# Patient Record
Sex: Female | Born: 1985 | Hispanic: No | Marital: Married | Smoking: Never smoker
Health system: Southern US, Community
[De-identification: ages and names within clinical notes are randomized; demographics above are authoritative.]

## PROBLEM LIST (undated history)

## (undated) ENCOUNTER — Inpatient Hospital Stay (HOSPITAL_COMMUNITY): Payer: Self-pay

## (undated) DIAGNOSIS — O24419 Gestational diabetes mellitus in pregnancy, unspecified control: Secondary | ICD-10-CM

## (undated) DIAGNOSIS — R000 Tachycardia, unspecified: Secondary | ICD-10-CM

## (undated) DIAGNOSIS — S62109A Fracture of unspecified carpal bone, unspecified wrist, initial encounter for closed fracture: Secondary | ICD-10-CM

## (undated) DIAGNOSIS — S92902A Unspecified fracture of left foot, initial encounter for closed fracture: Secondary | ICD-10-CM

## (undated) DIAGNOSIS — R519 Headache, unspecified: Secondary | ICD-10-CM

## (undated) DIAGNOSIS — E119 Type 2 diabetes mellitus without complications: Secondary | ICD-10-CM

## (undated) DIAGNOSIS — D6851 Activated protein C resistance: Secondary | ICD-10-CM

## (undated) DIAGNOSIS — R51 Headache: Secondary | ICD-10-CM

## (undated) HISTORY — DX: Gestational diabetes mellitus in pregnancy, unspecified control: O24.419

## (undated) HISTORY — PX: APPENDECTOMY: SHX54

## (undated) HISTORY — DX: Activated protein C resistance: D68.51

## (undated) HISTORY — DX: Type 2 diabetes mellitus without complications: E11.9

---

## 2015-05-29 ENCOUNTER — Inpatient Hospital Stay (HOSPITAL_COMMUNITY)
Admission: AD | Admit: 2015-05-29 | Discharge: 2015-05-29 | Disposition: A | Discharge status: Home / Self Care | Payer: Medicaid Other | Source: Ambulatory Visit | Attending: Obstetrics and Gynecology | Admitting: Obstetrics and Gynecology

## 2015-05-29 ENCOUNTER — Encounter (HOSPITAL_COMMUNITY): Payer: Self-pay | Admitting: *Deleted

## 2015-05-29 DIAGNOSIS — Z7901 Long term (current) use of anticoagulants: Secondary | ICD-10-CM | POA: Insufficient documentation

## 2015-05-29 DIAGNOSIS — O99119 Other diseases of the blood and blood-forming organs and certain disorders involving the immune mechanism complicating pregnancy, unspecified trimester: Secondary | ICD-10-CM | POA: Diagnosis present

## 2015-05-29 DIAGNOSIS — Z91120 Patient's intentional underdosing of medication regimen due to financial hardship: Secondary | ICD-10-CM | POA: Diagnosis not present

## 2015-05-29 DIAGNOSIS — Z3A25 25 weeks gestation of pregnancy: Secondary | ICD-10-CM | POA: Insufficient documentation

## 2015-05-29 DIAGNOSIS — D6851 Activated protein C resistance: Secondary | ICD-10-CM | POA: Diagnosis present

## 2015-05-29 DIAGNOSIS — O09899 Supervision of other high risk pregnancies, unspecified trimester: Secondary | ICD-10-CM

## 2015-05-29 DIAGNOSIS — Y92009 Unspecified place in unspecified non-institutional (private) residence as the place of occurrence of the external cause: Secondary | ICD-10-CM | POA: Insufficient documentation

## 2015-05-29 DIAGNOSIS — R5383 Other fatigue: Secondary | ICD-10-CM | POA: Diagnosis not present

## 2015-05-29 DIAGNOSIS — O2622 Pregnancy care for patient with recurrent pregnancy loss, second trimester: Secondary | ICD-10-CM | POA: Diagnosis not present

## 2015-05-29 DIAGNOSIS — T45516A Underdosing of anticoagulants, initial encounter: Secondary | ICD-10-CM | POA: Insufficient documentation

## 2015-05-29 DIAGNOSIS — Z7982 Long term (current) use of aspirin: Secondary | ICD-10-CM | POA: Diagnosis not present

## 2015-05-29 DIAGNOSIS — O0932 Supervision of pregnancy with insufficient antenatal care, second trimester: Secondary | ICD-10-CM

## 2015-05-29 DIAGNOSIS — D689 Coagulation defect, unspecified: Secondary | ICD-10-CM | POA: Diagnosis not present

## 2015-05-29 DIAGNOSIS — O99112 Other diseases of the blood and blood-forming organs and certain disorders involving the immune mechanism complicating pregnancy, second trimester: Secondary | ICD-10-CM | POA: Diagnosis not present

## 2015-05-29 DIAGNOSIS — Z758 Other problems related to medical facilities and other health care: Secondary | ICD-10-CM | POA: Diagnosis present

## 2015-05-29 DIAGNOSIS — Z789 Other specified health status: Secondary | ICD-10-CM | POA: Diagnosis present

## 2015-05-29 LAB — TYPE AND SCREEN
ABO/RH(D): O POS
Antibody Screen: NEGATIVE

## 2015-05-29 LAB — URINALYSIS, ROUTINE W REFLEX MICROSCOPIC
Bilirubin Urine: NEGATIVE
Glucose, UA: NEGATIVE mg/dL
Hgb urine dipstick: NEGATIVE
KETONES UR: NEGATIVE mg/dL
LEUKOCYTES UA: NEGATIVE
NITRITE: NEGATIVE
PH: 6 (ref 5.0–8.0)
Protein, ur: NEGATIVE mg/dL

## 2015-05-29 LAB — ABO/RH: ABO/RH(D): O POS

## 2015-05-29 LAB — ANTITHROMBIN III: AntiThromb III Func: 108 % (ref 75–120)

## 2015-05-29 MED ORDER — ASPIRIN 81 MG PO CHEW: 81.0000 mg | CHEWABLE_TABLET | Freq: Every day | ORAL | Status: DC

## 2015-05-29 MED ORDER — ENOXAPARIN SODIUM 40 MG/0.4ML ~~LOC~~ SOLN: 40.0000 mg | SUBCUTANEOUS | Status: DC

## 2015-05-29 MED ORDER — ENOXAPARIN SODIUM 40 MG/0.4ML ~~LOC~~ SOLN
40.0000 mg | Freq: Once | SUBCUTANEOUS | Status: AC
  Administered 2015-05-29: 40 mg via SUBCUTANEOUS
  Filled 2015-05-29: qty 0.4

## 2015-05-29 MED ORDER — CONCEPT OB 130-92.4-1 MG PO CAPS: 1.0000 | ORAL_CAPSULE | Freq: Every day | ORAL | Status: DC

## 2015-05-29 NOTE — Discharge Instructions (Signed)
Handout given

## 2015-05-29 NOTE — Care Management Note (Signed)
Case Management Note  Patient Details  Name: Sonya Turner MRN: [id number] Date of Birth: 06/30/1985  Subjective/Objective:   Blood coagulation disorder complicating pregnancy, second trimester                    Action/Plan: Lovenox 40mg  q day.  Expected Discharge Date:   05/29/15               Expected Discharge Plan:  Home/Self Care  Discharge planning Services  [facility]  Post Acute Care Choice:  NA Choice offered to:  NA  DME Arranged:  N/A DME Agency:  NA  HH Arranged:  NA HH Agency:  NA  Status of Service:   Complete  Additional Comments: Case Manager called by CNM in MAU to assist pt with obtaining her medication.  Stated that pt just arrived in this country from [place] and only speaks Arabic.  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  She was taking Heparin but has nothing at this time.  Can approve initial 30 day supply of Lovenox and would suggest using one of the [facility] if possible (but can use any pharmacy that participates in [facility]). Case Manager spoke to pt's Nurse and CNM.  Case Manager then spoke to the pt via the interpreter line - discussed the medication and the [facility].  Pt stated that she would be able to pay the $3.00 copay.  Explained about the pharmacy choice and [facility] - [facility] did verify that [facility] did have a 30 day supply of the Lovenox dose.  CM gave the pt the [facility] and the CNM was going to print the Rx and give to pt so that she could take it with her.  Pt has CM's card w/ name and cell # on it if she has any problems obtaining the medication.  Explained to the pt that she needs to get the medication today so that she would have it for tomorrow's dose.  Questions answered.  CM also requested that pt call or tell the Clinic when she has about 5 days left of the  medication so that we will have time to assist w/ refilling the Lovenox - pt voiced understanding. CM available to assist as needed.  Roseanne RenoJohnson, Faithlynn Deeley TyroBaker, [place]   [phone] 05/29/2015, 4:45 PM

## 2015-05-29 NOTE — MAU Provider Note (Signed)
Chief Complaint:  Fatigue   First Provider Initiated Contact with Patient 05/29/15 1547      HPI: Sonya Turner is a 29 y.o. [id number] at 2066w3d who presents to maternity admissions [place] and needing to start prenatal care. 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 States she ran out of her medicine. Last took it for 4 days ago. Cannot afford to purchase the medicine. Does not know the name of her clotting disorder. Denies history of DVT, MI, CVA or PE.  Denies contractions, leakage of fluid or vaginal bleeding. Good fetal movement.   [employer] Arabic interpreter used.  Past Medical History: Past Medical History  Diagnosis Date  . Medical history non-contributory     Past obstetric history: OB History  Gravida Para Term Preterm AB SAB TAB Ectopic Multiple Living  8 3 3  4 4    3     # Outcome Date GA Lbr Len/2nd Weight Sex Delivery Anes PTL Lv  8 Current           7 SAB           6 SAB           5 SAB           4 SAB           3 Term           2 Term           1 Term               Past Surgical History: Past Surgical History  Procedure Laterality Date  . Appendectomy       Family History: No family history on file.  Social History: Social History  Substance Use Topics  . Smoking status: Never Smoker   . Smokeless tobacco: Never Used  . Alcohol Use: No    Allergies: No Known Allergies  Meds:  Prescriptions prior to admission  Medication Sig Dispense Refill Last Dose  . Tinzaparin Sodium (INNOHEP [place]) Inject 5,000 Units into the skin every other day.   05/28/2015 at Unknown time  . [DISCONTINUED] aspirin 81 MG chewable tablet Chew 81 mg by mouth daily.   05/26/2015    I have reviewed patient's Past Medical Hx, Surgical Hx, Family Hx, Social Hx, medications and allergies.   ROS:  Review of Systems   Constitutional: Negative for fever and chills.  Gastrointestinal: Negative for abdominal pain.  Genitourinary: Negative for vaginal bleeding.  Hematological: Does not bruise/bleed easily.    Physical Exam  Patient Vitals for the past 24 hrs:  BP Temp Temp src Pulse Resp  05/29/15 1336 107/59 mmHg 98.1 F (36.7 C) Oral 92 18   Constitutional: Well-developed, well-nourished female in no acute distress.  Cardiovascular: normal rate Respiratory: normal effort GI: Abd soft, non-tender, gravid appropriate for gestational age.  Neurologic: Alert and oriented x 4.  GU: Deferred    FHT:  Baseline 140 , moderate variability, accelerations present, no decelerations Contractions: None   Labs: Results for orders placed or performed during the hospital encounter of 05/29/15 (from the past 24 hour(s))  Urinalysis, Routine w reflex microscopic (not at [facility])     Status: Abnormal   Collection Time: 05/29/15  1:40 PM  Result Value Ref Range   Color, Urine YELLOW YELLOW   APPearance HAZY (A)  CLEAR   Specific Gravity, Urine >1.030 (H) 1.005 - 1.030   pH 6.0 5.0 - 8.0   Glucose, UA NEGATIVE NEGATIVE mg/dL   Hgb urine dipstick NEGATIVE NEGATIVE   Bilirubin Urine NEGATIVE NEGATIVE   Ketones, ur NEGATIVE NEGATIVE mg/dL   Protein, ur NEGATIVE NEGATIVE mg/dL   Nitrite NEGATIVE NEGATIVE   Leukocytes, UA NEGATIVE NEGATIVE    Imaging:  No results found.  MAU Course: NST  Unsure of what clotting disorder diagnosis is--possibly anticardiolipin syndrome? CNM researched Innohep and found that it is low molecular weight heparin. Patient was on prophylactic dose.  Discussed patient's history, exam, medical history and anticoagulant therapy with Dr. Jolayne Panther. Referred to high risk clinic. Case management came to assist patient in obtaining Lovenox. New OB panel, hypercoagulability panel, Lovenox 40 mg subcutaneous given. Patient demonstrated ability to give herself injections. Bleeding precautions  reviewed.  MDM: 29 year old female at 70 weeks 3 days gestation with unknown clotting disorder on anticoagulant therapy needing to obtain high risk OB care in the [place] and resume anticoagulant therapy. No evidence of emergent condition.  Assessment: 1. Blood coagulation disorder complicating pregnancy, second trimester (HCC)   2. Limited prenatal care in second trimester     Plan: Discharge home in stable condition.  Preterm labor precautions, bleeding precautions  and fetal kick counts Anatomy scan ordered.  Be panel and hypercoagulability panel pending.  Follow-up Information    Follow [facility] with [facility].   Specialty:  Obstetrics and Gynecology   Why:  Start prenatal care. Your appointment is at 8:20 am. Go to the clinic on the ground floor of [facility].    Contact information:   [address] [place] [address] [phone]      Follow up with [facility] [place] [facility].   Why:  As needed in emergencies   Contact information:   [address] [id number] mc [place] [address] [phone]        Medication List    STOP taking these medications        INNOHEP [place]      TAKE these medications        aspirin 81 MG chewable tablet  Chew 1 tablet (81 mg total) by mouth daily.     CONCEPT OB 130-92.4-1 MG Caps  Take 1 tablet by mouth daily.     enoxaparin 40 MG/0.4ML injection  Commonly known as:  LOVENOX  Inject 0.4 mLs (40 mg total) into the skin daily.        [place], [place] 05/29/2015 3:39 PM

## 2015-05-29 NOTE — MAU Note (Signed)
Pt reports she is feeling fatigued and wants to make sure the baby is ok. Has had prenatal care in [place]. Has been here in [place] for 3 days. Does not have prenatal records with her. Pt stated she is taking a baby aspirin daily and an injection called Inoheeb (blood thinner) . Because she said that her blood clots  stop blood flow to the placenta.

## 2015-06-12 ENCOUNTER — Encounter: Payer: Self-pay | Admitting: Obstetrics & Gynecology

## 2015-06-17 ENCOUNTER — Encounter: Payer: Self-pay | Admitting: Certified Nurse Midwife

## 2015-06-22 NOTE — L&D Delivery Note (Signed)
Patient is 30 y.o. [id number] 3813w5d admitted in active labor, hx of Heterozygous factor V Leiden affecting pregnancy.  Delivery Note At 4:31 PM a viable female was delivered via Vaginal, Spontaneous Delivery (Presentation: ;  ).  APGAR: 9, 9; weight pending.  Placenta status: Intact, Spontaneous.  Cord: 3 vessels with the following complications: None . Upon arrival patient was complete and pushing. She pushed with good maternal effort to deliver a healthy baby. Baby delivered without difficulty, was noted to have good tone and place on maternal abdomen for drying and stimulation. Delayed cord clamping performed.   Anesthesia: None  Episiotomy: None Lacerations: 1 degree perineal Est. Blood Loss (mL):  181  Mom to postpartum.  Baby to Couplet care / Skin to Skin.   [name], DO 09/06/2015, 4:48 PM PGY-2, [facility]  I was present for the entire delivery of baby and placenta and inspection of perineum and agree with above. 1st degree perineal lac hemostatic. No repair.   [name], [place] 09/06/2015 5:13 PM

## 2015-06-27 ENCOUNTER — Ambulatory Visit (HOSPITAL_COMMUNITY)
Admission: RE | Admit: 2015-06-27 | Discharge: 2015-06-27 | Disposition: A | Discharge status: Home / Self Care | Payer: Medicaid Other | Source: Ambulatory Visit | Attending: Advanced Practice Midwife | Admitting: Advanced Practice Midwife

## 2015-06-27 ENCOUNTER — Encounter (HOSPITAL_COMMUNITY): Payer: Self-pay

## 2015-06-27 DIAGNOSIS — O0932 Supervision of pregnancy with insufficient antenatal care, second trimester: Secondary | ICD-10-CM

## 2015-06-27 DIAGNOSIS — O0933 Supervision of pregnancy with insufficient antenatal care, third trimester: Secondary | ICD-10-CM | POA: Insufficient documentation

## 2015-06-27 DIAGNOSIS — D689 Coagulation defect, unspecified: Secondary | ICD-10-CM

## 2015-06-27 DIAGNOSIS — Z3A29 29 weeks gestation of pregnancy: Secondary | ICD-10-CM | POA: Insufficient documentation

## 2015-06-27 DIAGNOSIS — O99112 Other diseases of the blood and blood-forming organs and certain disorders involving the immune mechanism complicating pregnancy, second trimester: Secondary | ICD-10-CM

## 2015-06-27 DIAGNOSIS — Z36 Encounter for antenatal screening of mother: Secondary | ICD-10-CM | POA: Insufficient documentation

## 2015-06-27 LAB — LUPUS ANTICOAGULANT PANEL
DRVVT: 42.6 s (ref 0.0–44.0)
PTT LA: 49.3 s — AB (ref 0.0–40.6)

## 2015-06-27 LAB — CARDIOLIPIN ANTIBODIES, IGG, IGM, IGA
Anticardiolipin IgA: <9 APL U/mL (ref 0–11)
Anticardiolipin IgG: <9 GPL U/mL (ref 0–14)

## 2015-06-27 LAB — RPR: RPR Ser Ql: NONREACTIVE

## 2015-06-27 LAB — PROTHROMBIN GENE MUTATION

## 2015-06-27 LAB — PROTEIN S ACTIVITY: Protein S Activity: 43 % — ABNORMAL LOW (ref 63–140)

## 2015-06-27 LAB — RUBELLA SCREEN: Rubella: 6.24 index (ref >0.99)

## 2015-06-27 LAB — BETA-2-GLYCOPROTEIN I ABS, IGG/M/A: Beta-2-Glycoprotein I IgM: <9 GPI IgM units (ref 0–32)

## 2015-06-27 LAB — HEPATITIS B SURFACE ANTIGEN: HEP B S AG: NEGATIVE

## 2015-06-27 LAB — FACTOR 5 LEIDEN

## 2015-06-27 LAB — HEXAGONAL PHASE PHOSPHOLIPID: HEXAGONAL PHASE PHOSPHOLIPID: 0 s (ref 0–11)

## 2015-06-27 LAB — PROTEIN S, TOTAL: PROTEIN S AG TOTAL: 90 % (ref 60–150)

## 2015-06-27 LAB — PTT-LA MIX: PTT-LA Mix: 43.2 s — ABNORMAL HIGH (ref 0.0–40.6)

## 2015-06-27 LAB — HOMOCYSTEINE: Homocysteine: 3.1 umol/L (ref 0.0–15.0)

## 2015-06-27 LAB — HIV ANTIBODY (ROUTINE TESTING W REFLEX): HIV SCREEN 4TH GENERATION: NONREACTIVE

## 2015-06-27 LAB — PROTEIN C, TOTAL: PROTEIN C, TOTAL: 117 % (ref 60–150)

## 2015-06-27 LAB — PROTEIN C ACTIVITY: PROTEIN C ACTIVITY: 154 % (ref 73–180)

## 2015-07-03 ENCOUNTER — Ambulatory Visit (INDEPENDENT_AMBULATORY_CARE_PROVIDER_SITE_OTHER): Payer: Medicaid Other | Admitting: Family Medicine

## 2015-07-03 ENCOUNTER — Encounter: Payer: Self-pay | Admitting: Family Medicine

## 2015-07-03 VITALS — BP 116/72 | HR 126 | Wt 136.9 lb

## 2015-07-03 DIAGNOSIS — D6851 Activated protein C resistance: Secondary | ICD-10-CM | POA: Diagnosis not present

## 2015-07-03 DIAGNOSIS — Z789 Other specified health status: Secondary | ICD-10-CM

## 2015-07-03 DIAGNOSIS — Z113 Encounter for screening for infections with a predominantly sexual mode of transmission: Secondary | ICD-10-CM

## 2015-07-03 DIAGNOSIS — Z23 Encounter for immunization: Secondary | ICD-10-CM

## 2015-07-03 DIAGNOSIS — O99119 Other diseases of the blood and blood-forming organs and certain disorders involving the immune mechanism complicating pregnancy, unspecified trimester: Secondary | ICD-10-CM | POA: Diagnosis not present

## 2015-07-03 DIAGNOSIS — O2623 Pregnancy care for patient with recurrent pregnancy loss, third trimester: Secondary | ICD-10-CM | POA: Diagnosis not present

## 2015-07-03 DIAGNOSIS — O09893 Supervision of other high risk pregnancies, third trimester: Secondary | ICD-10-CM

## 2015-07-03 DIAGNOSIS — O2622 Pregnancy care for patient with recurrent pregnancy loss, second trimester: Secondary | ICD-10-CM | POA: Diagnosis not present

## 2015-07-03 DIAGNOSIS — O99113 Other diseases of the blood and blood-forming organs and certain disorders involving the immune mechanism complicating pregnancy, third trimester: Secondary | ICD-10-CM

## 2015-07-03 LAB — CBC
HEMATOCRIT: 33.2 % — AB (ref 36.0–46.0)
HEMOGLOBIN: 11.3 g/dL — AB (ref 12.0–15.0)
MCH: 29 pg (ref 26.0–34.0)
MCHC: 34 g/dL (ref 30.0–36.0)
MCV: 85.1 fL (ref 78.0–100.0)
MPV: 9.1 fL (ref 8.6–12.4)
Platelets: 300 10*3/uL (ref 150–400)
RBC: 3.9 MIL/uL (ref 3.87–5.11)
RDW: 13.4 % (ref 11.5–15.5)
WBC: 7 10*3/uL (ref 4.0–10.5)

## 2015-07-03 LAB — POCT URINALYSIS DIP (DEVICE)
BILIRUBIN URINE: NEGATIVE
Glucose, UA: NEGATIVE mg/dL
Hgb urine dipstick: NEGATIVE
Ketones, ur: NEGATIVE mg/dL
Leukocytes, UA: NEGATIVE
NITRITE: NEGATIVE
PH: 5.5 (ref 5.0–8.0)
PROTEIN: NEGATIVE mg/dL
Specific Gravity, Urine: >=1.03 (ref 1.005–1.030)
Urobilinogen, UA: 0.2 mg/dL (ref 0.0–1.0)

## 2015-07-03 MED ORDER — CONCEPT OB 130-92.4-1 MG PO CAPS: 1.0000 | ORAL_CAPSULE | Freq: Every day | ORAL | Status: DC

## 2015-07-03 MED ORDER — TETANUS-DIPHTH-ACELL PERTUSSIS 5-2.5-18.5 LF-MCG/0.5 IM SUSP
0.5000 mL | Freq: Once | INTRAMUSCULAR | Status: AC
  Administered 2015-07-03: 0.5 mL via INTRAMUSCULAR

## 2015-07-03 MED ORDER — ENOXAPARIN SODIUM 40 MG/0.4ML ~~LOC~~ SOLN: 40.0000 mg | SUBCUTANEOUS | Status: DC

## 2015-07-03 NOTE — Progress Notes (Signed)
Subjective:  Sonya Turner is a 30 y.o. [id number] at 4040w3d being seen today for ongoing prenatal care.  She is currently monitored for the following issues for this high-risk pregnancy and has Supervision of other high risk pregnancy, antepartum; Heterozygous factor V Leiden affecting pregnancy, antepartum (HCC); Language barrier; Limited prenatal care in second trimester; and Four previous miscarriages, affecting care of mother in second trimester, antepartum on her problem list.  Patient reports no complaints.  Contractions: Not present. Vag. Bleeding: None.  Movement: Present. Denies leaking of fluid.   The following portions of the patient's history were reviewed and updated as appropriate: allergies, current medications, past family history, past medical history, past social history, past surgical history and problem list. Problem list updated.  Objective:   Filed Vitals:   07/03/15 1327  BP: 116/72  Pulse: 126  Weight: 136 lb 14.4 oz (62.097 kg)    Fetal Status: Fetal Heart Rate (bpm): 144 Fundal Height: 29 cm Movement: Present     General:  Alert, oriented and cooperative. Patient is in no acute distress.  Skin: Skin is warm and dry. No rash noted.   Cardiovascular: Normal heart rate noted  Respiratory: Normal respiratory effort, no problems with respiration noted  Abdomen: Soft, gravid, appropriate for gestational age. Pain/Pressure: Present     Pelvic: Vag. Bleeding: None     Cervical exam deferred        Extremities: Normal range of motion.  Edema: None  Mental Status: Normal mood and affect. Normal behavior. Normal judgment and thought content.   Urinalysis:      Assessment and Plan:  Pregnancy: [id number] at 6940w3d  1. Heterozygous factor V Leiden affecting pregnancy, antepartum (HCC) -Discussed diagnosis today, patient previously unaware of condition - enoxaparin (LOVENOX) 40 MG/0.4ML injection; Inject 0.4 mLs (40 mg total) into the skin daily.  Dispense: 30 Syringe; Refill:  6  2. Four previous miscarriages, affecting care of mother in second trimester, antepartum Likely 2/2 to above  3. Language barrier Arabic speaking, interpreter used.   4. Supervision of other high risk pregnancy, antepartum, third trimester - Glucose Tolerance, 1 HR (50g) w/o Fasting - CBC - Culture, OB Urine - Prescript Monitor Profile(19) - GC/Chlamydia probe amp ([place])not at [facility] - Prenat w/o A Vit-FeFum-FePo-FA (CONCEPT OB) 130-92.4-1 MG CAPS; Take 1 tablet by mouth daily.  Dispense: 30 capsule; Refill: 12  Preterm labor symptoms and general obstetric precautions including but not limited to vaginal bleeding, contractions, leaking of fluid and fetal movement were reviewed in detail with the patient. Please refer to After Visit Summary for other counseling recommendations.   Return in about 2 weeks (around 07/17/2015) for Routine prenatal care.  Future Appointments Date Time Provider Department Center  07/17/2015 10:15 AM Catalina AntiguaPeggy Constant, MD [facility]   Federico FlakeKimberly Niles Beronica Lansdale, MD

## 2015-07-03 NOTE — Patient Instructions (Addendum)
Safe Medications in Pregnancy   Acne: Benzoyl Peroxide Salicylic Acid  Backache/Headache: Tylenol: 2 regular strength every 4 hours OR              2 Extra strength every 6 hours  Colds/Coughs/Allergies: Benadryl (alcohol free) 25 mg every 6 hours as needed Breath right strips Claritin Cepacol throat lozenges Chloraseptic throat spray Cold-Eeze- up to three times per day Cough drops, alcohol free Flonase (by prescription only) Guaifenesin Mucinex Robitussin DM (plain only, alcohol free) Saline nasal spray/drops Sudafed (pseudoephedrine) & Actifed ** use only after [redacted] weeks gestation and if you do not have high blood pressure Tylenol Vicks Vaporub Zinc lozenges Zyrtec   Constipation: Colace Ducolax suppositories Fleet enema Glycerin suppositories Metamucil Milk of magnesia Miralax Senokot Smooth move tea  Diarrhea: Kaopectate Imodium A-D  *NO pepto Bismol  Hemorrhoids: Anusol Anusol HC Preparation H Tucks  Indigestion: Tums Maalox Mylanta Zantac  Pepcid  Insomnia: Benadryl (alcohol free) 25mg every 6 hours as needed Tylenol PM Unisom, no Gelcaps  Leg Cramps: Tums MagGel  Nausea/Vomiting:  Bonine Dramamine Emetrol Ginger extract Sea bands Meclizine  Nausea medication to take during pregnancy:  Unisom (doxylamine succinate 25 mg tablets) Take one tablet daily at bedtime. If symptoms are not adequately controlled, the dose can be increased to a maximum recommended dose of two tablets daily (1/2 tablet in the morning, 1/2 tablet mid-afternoon and one at bedtime). Vitamin B6 100mg tablets. Take one tablet twice a day (up to 200 mg per day).  Skin Rashes: Aveeno products Benadryl cream or 25mg every 6 hours as needed Calamine Lotion 1% cortisone cream  Yeast infection: Gyne-lotrimin 7 Monistat 7   **If taking multiple medications, please check labels to avoid duplicating the same active ingredients **take medication as directed on  the label ** Do not exceed 4000 mg of tylenol in 24 hours **Do not take medications that contain aspirin or ibuprofen    Third Trimester of Pregnancy The third trimester is from week 29 through week 42, months 7 through 9. The third trimester is a time when the fetus is growing rapidly. At the end of the ninth month, the fetus is about 20 inches in length and weighs 6-10 pounds.  BODY CHANGES Your body goes through many changes during pregnancy. The changes vary from woman to woman.   Your weight will continue to increase. You can expect to gain 25-35 pounds (11-16 kg) by the end of the pregnancy.  You may begin to get stretch marks on your hips, abdomen, and breasts.  You may urinate more often because the fetus is moving lower into your pelvis and pressing on your bladder.  You may develop or continue to have heartburn as a result of your pregnancy.  You may develop constipation because certain hormones are causing the muscles that push waste through your intestines to slow down.  You may develop hemorrhoids or swollen, bulging veins (varicose veins).  You may have pelvic pain because of the weight gain and pregnancy hormones relaxing your joints between the bones in your pelvis. Backaches may result from overexertion of the muscles supporting your posture.  You may have changes in your hair. These can include thickening of your hair, rapid growth, and changes in texture. Some women also have hair loss during or after pregnancy, or hair that feels dry or thin. Your hair will most likely return to normal after your baby is born.  Your breasts will continue to grow and be tender. A yellow discharge   may leak from your breasts called colostrum.  Your belly button may stick out.  You may feel short of breath because of your expanding uterus.  You may notice the fetus "dropping," or moving lower in your abdomen.  You may have a bloody mucus discharge. This usually occurs a few days to  a week before labor begins.  Your cervix becomes thin and soft (effaced) near your due date. WHAT TO EXPECT AT YOUR PRENATAL EXAMS  You will have prenatal exams every 2 weeks until week 36. Then, you will have weekly prenatal exams. During a routine prenatal visit:  You will be weighed to make sure you and the fetus are growing normally.  Your blood pressure is taken.  Your abdomen will be measured to track your baby's growth.  The fetal heartbeat will be listened to.  Any test results from the previous visit will be discussed.  You may have a cervical check near your due date to see if you have effaced. At around 36 weeks, your caregiver will check your cervix. At the same time, your caregiver will also perform a test on the secretions of the vaginal tissue. This test is to determine if a type of bacteria, Group B streptococcus, is present. Your caregiver will explain this further. Your caregiver may ask you:  What your birth plan is.  How you are feeling.  If you are feeling the baby move.  If you have had any abnormal symptoms, such as leaking fluid, bleeding, severe headaches, or abdominal cramping.  If you are using any tobacco products, including cigarettes, chewing tobacco, and electronic cigarettes.  If you have any questions. Other tests or screenings that may be performed during your third trimester include:  Blood tests that check for low iron levels (anemia).  Fetal testing to check the health, activity level, and growth of the fetus. Testing is done if you have certain medical conditions or if there are problems during the pregnancy.  HIV (human immunodeficiency virus) testing. If you are at high risk, you may be screened for HIV during your third trimester of pregnancy. FALSE LABOR You may feel small, irregular contractions that eventually go away. These are called Braxton Hicks contractions, or false labor. Contractions may last for hours, days, or even weeks  before true labor sets in. If contractions come at regular intervals, intensify, or become painful, it is best to be seen by your caregiver.  SIGNS OF LABOR   Menstrual-like cramps.  Contractions that are 5 minutes apart or less.  Contractions that start on the top of the uterus and spread down to the lower abdomen and back.  A sense of increased pelvic pressure or back pain.  A watery or bloody mucus discharge that comes from the vagina. If you have any of these signs before the 37th week of pregnancy, call your caregiver right away. You need to go to the hospital to get checked immediately. HOME CARE INSTRUCTIONS   Avoid all smoking, herbs, alcohol, and unprescribed drugs. These chemicals affect the formation and growth of the baby.  Do not use any tobacco products, including cigarettes, chewing tobacco, and electronic cigarettes. If you need help quitting, ask your health care provider. You may receive counseling support and other resources to help you quit.  Follow your caregiver's instructions regarding medicine use. There are medicines that are either safe or unsafe to take during pregnancy.  Exercise only as directed by your caregiver. Experiencing uterine cramps is a good sign to stop   exercising.  Continue to eat regular, healthy meals.  Wear a good support bra for breast tenderness.  Do not use hot tubs, steam rooms, or saunas.  Wear your seat belt at all times when driving.  Avoid raw meat, uncooked cheese, cat litter boxes, and soil used by cats. These carry germs that can cause birth defects in the baby.  Take your prenatal vitamins.  Take 1500-2000 mg of calcium daily starting at the 20th week of pregnancy until you deliver your baby.  Try taking a stool softener (if your caregiver approves) if you develop constipation. Eat more high-fiber foods, such as fresh vegetables or fruit and whole grains. Drink plenty of fluids to keep your urine clear or pale  yellow.  Take warm sitz baths to soothe any pain or discomfort caused by hemorrhoids. Use hemorrhoid cream if your caregiver approves.  If you develop varicose veins, wear support hose. Elevate your feet for 15 minutes, 3-4 times a day. Limit salt in your diet.  Avoid heavy lifting, wear low heal shoes, and practice good posture.  Rest a lot with your legs elevated if you have leg cramps or low back pain.  Visit your dentist if you have not gone during your pregnancy. Use a soft toothbrush to brush your teeth and be gentle when you floss.  A sexual relationship may be continued unless your caregiver directs you otherwise.  Do not travel far distances unless it is absolutely necessary and only with the approval of your caregiver.  Take prenatal classes to understand, practice, and ask questions about the labor and delivery.  Make a trial run to the hospital.  Pack your hospital bag.  Prepare the baby's nursery.  Continue to go to all your prenatal visits as directed by your caregiver. SEEK MEDICAL CARE IF:  You are unsure if you are in labor or if your water has broken.  You have dizziness.  You have mild pelvic cramps, pelvic pressure, or nagging pain in your abdominal area.  You have persistent nausea, vomiting, or diarrhea.  You have a bad smelling vaginal discharge.  You have pain with urination. SEEK IMMEDIATE MEDICAL CARE IF:   You have a fever.  You are leaking fluid from your vagina.  You have spotting or bleeding from your vagina.  You have severe abdominal cramping or pain.  You have rapid weight loss or gain.  You have shortness of breath with chest pain.  You notice sudden or extreme swelling of your face, hands, ankles, feet, or legs.  You have not felt your baby move in over an hour.  You have severe headaches that do not go away with medicine.  You have vision changes.   This information is not intended to replace advice given to you by your  health care provider. Make sure you discuss any questions you have with your health care provider.   Document Released: 06/01/2001 Document Revised: 06/28/2014 Document Reviewed: 08/08/2012 Elsevier Interactive Patient Education 2016 Elsevier Inc.  

## 2015-07-03 NOTE — Addendum Note (Signed)
Addended by: Geanie BerlinNEWTON, Annisa Mazzarella N on: 07/03/2015 04:23 PM   Modules accepted: Orders, Medications

## 2015-07-04 LAB — PRESCRIPTION MONITORING PROFILE (19 PANEL)
Amphetamine/Meth: NEGATIVE ng/mL
BARBITURATE SCREEN, URINE: NEGATIVE ng/mL
BENZODIAZEPINE SCREEN, URINE: NEGATIVE ng/mL
Buprenorphine, Urine: NEGATIVE ng/mL
Cannabinoid Scrn, Ur: NEGATIVE ng/mL
Carisoprodol, Urine: NEGATIVE ng/mL
Cocaine Metabolites: NEGATIVE ng/mL
Creatinine, Urine: 119.81 mg/dL (ref >20.0)
Fentanyl, Ur: NEGATIVE ng/mL
MDMA URINE: NEGATIVE ng/mL
Meperidine, Ur: NEGATIVE ng/mL
Methadone Screen, Urine: NEGATIVE ng/mL
Methaqualone: NEGATIVE ng/mL
NITRITES URINE, INITIAL: NEGATIVE ug/mL
OPIATE SCREEN, URINE: NEGATIVE ng/mL
Oxycodone Screen, Ur: NEGATIVE ng/mL
PROPOXYPHENE: NEGATIVE ng/mL
Phencyclidine, Ur: NEGATIVE ng/mL
TRAMADOL UR: NEGATIVE ng/mL
Tapentadol, urine: NEGATIVE ng/mL
ZOLPIDEM, URINE: NEGATIVE ng/mL
pH, Initial: 5.6 pH (ref 4.5–8.9)

## 2015-07-04 LAB — GLUCOSE TOLERANCE, 1 HOUR (50G) W/O FASTING: GLUCOSE 1 HOUR GTT: 144 mg/dL — AB (ref 70–140)

## 2015-07-05 LAB — CULTURE, OB URINE: Colony Count: 60000

## 2015-07-11 ENCOUNTER — Telehealth: Payer: Self-pay | Admitting: *Deleted

## 2015-07-11 NOTE — Telephone Encounter (Signed)
Attempted to call patient using Arabic interpreter [phone]. Per Dr Alvester Morin she needs to be contacted to schedule a 3 hr gtt. There was no answer, voice mail was left stating we are calling with test results, please call the clinic.

## 2015-07-14 NOTE — Telephone Encounter (Signed)
Called pt with [facility] Interpreter # [phone] and left message stating that I am calling with test result information. Please call back.  ** Pt has clinic appt on 1/26 - note placed in appt info

## 2015-07-17 ENCOUNTER — Ambulatory Visit (INDEPENDENT_AMBULATORY_CARE_PROVIDER_SITE_OTHER): Payer: Medicaid Other | Admitting: Obstetrics and Gynecology

## 2015-07-17 ENCOUNTER — Encounter: Payer: Self-pay | Admitting: Obstetrics and Gynecology

## 2015-07-17 VITALS — BP 114/63 | HR 108 | Temp 98.2°F | Wt 141.6 lb

## 2015-07-17 DIAGNOSIS — D6851 Activated protein C resistance: Secondary | ICD-10-CM | POA: Diagnosis not present

## 2015-07-17 DIAGNOSIS — O0933 Supervision of pregnancy with insufficient antenatal care, third trimester: Secondary | ICD-10-CM | POA: Diagnosis present

## 2015-07-17 DIAGNOSIS — O2622 Pregnancy care for patient with recurrent pregnancy loss, second trimester: Secondary | ICD-10-CM

## 2015-07-17 DIAGNOSIS — O0932 Supervision of pregnancy with insufficient antenatal care, second trimester: Secondary | ICD-10-CM

## 2015-07-17 DIAGNOSIS — O99119 Other diseases of the blood and blood-forming organs and certain disorders involving the immune mechanism complicating pregnancy, unspecified trimester: Secondary | ICD-10-CM

## 2015-07-17 DIAGNOSIS — Z789 Other specified health status: Secondary | ICD-10-CM | POA: Diagnosis not present

## 2015-07-17 DIAGNOSIS — O09893 Supervision of other high risk pregnancies, third trimester: Secondary | ICD-10-CM

## 2015-07-17 DIAGNOSIS — O2623 Pregnancy care for patient with recurrent pregnancy loss, third trimester: Secondary | ICD-10-CM | POA: Diagnosis not present

## 2015-07-17 LAB — POCT URINALYSIS DIP (DEVICE)
BILIRUBIN URINE: NEGATIVE
GLUCOSE, UA: NEGATIVE mg/dL
Hgb urine dipstick: NEGATIVE
KETONES UR: NEGATIVE mg/dL
Nitrite: NEGATIVE
PH: 5.5 (ref 5.0–8.0)
Protein, ur: NEGATIVE mg/dL
Urobilinogen, UA: 0.2 mg/dL (ref 0.0–1.0)

## 2015-07-17 NOTE — Progress Notes (Signed)
Breastfeeding tip of the week reviewed Patient needs 3 hr gtt, will schedule test before she leaves

## 2015-07-17 NOTE — Progress Notes (Signed)
Subjective:  Sonya Turner is a 30 y.o. [id number] at [redacted]w[redacted]d being seen today for ongoing prenatal care.  She is currently monitored for the following issues for this high-risk pregnancy and has Supervision of other high risk pregnancy, antepartum; Heterozygous factor V Leiden affecting pregnancy, antepartum (HCC); Language barrier; Limited prenatal care in second trimester; and Four previous miscarriages, affecting care of mother in second trimester, antepartum on her problem list.  Patient reports no complaints.  Contractions: Not present. Vag. Bleeding: None.  Movement: Present. Denies leaking of fluid.   The following portions of the patient's history were reviewed and updated as appropriate: allergies, current medications, past family history, past medical history, past social history, past surgical history and problem list. Problem list updated.  Objective:   Filed Vitals:   07/17/15 1053  BP: 114/63  Pulse: 108  Temp: 98.2 F (36.8 C)  Weight: 141 lb 9.6 oz (64.229 kg)    Fetal Status: Fetal Heart Rate (bpm): 140   Movement: Present     General:  Alert, oriented and cooperative. Patient is in no acute distress.  Skin: Skin is warm and dry. No rash noted.   Cardiovascular: Normal heart rate noted  Respiratory: Normal respiratory effort, no problems with respiration noted  Abdomen: Soft, gravid, appropriate for gestational age. Pain/Pressure: Present     Pelvic: Vag. Bleeding: None     Cervical exam deferred        Extremities: Normal range of motion.  Edema: None  Mental Status: Normal mood and affect. Normal behavior. Normal judgment and thought content.   Urinalysis: Urine Protein: Negative Urine Glucose: Negative  Assessment and Plan:  Pregnancy: [name] at [redacted]w[redacted]d  1. Supervision of other high risk pregnancy, antepartum, third trimester Patient is doing well Informed of abnormal 1 hour glucola and need for 3 hour test. Patient will return prior to next appointment for it  2.  Limited prenatal care in second trimester   3. Language barrier Arabic interpreter present   4. Heterozygous factor V Leiden affecting pregnancy, antepartum (HCC) Continue lovenox  5. Four previous miscarriages, affecting care of mother in second trimester, antepartum   Preterm labor symptoms and general obstetric precautions including but not limited to vaginal bleeding, contractions, leaking of fluid and fetal movement were reviewed in detail with the patient. Please refer to After Visit Summary for other counseling recommendations.  Return in about 2 weeks (around 07/31/2015).   Catalina Antigua, MD

## 2015-07-21 ENCOUNTER — Other Ambulatory Visit: Payer: Medicaid Other

## 2015-07-21 DIAGNOSIS — O99810 Abnormal glucose complicating pregnancy: Secondary | ICD-10-CM

## 2015-07-22 ENCOUNTER — Other Ambulatory Visit: Payer: Medicaid Other

## 2015-07-22 LAB — GLUCOSE TOLERANCE, 3 HOURS
GLUCOSE 3 HOUR GTT: 81 mg/dL (ref 70–144)
Glucose Tolerance, 1 hour: 148 mg/dL (ref 70–189)
Glucose Tolerance, 2 hour: 162 mg/dL (ref 70–164)
Glucose Tolerance, Fasting: 79 mg/dL (ref 65–99)

## 2015-07-28 ENCOUNTER — Ambulatory Visit (INDEPENDENT_AMBULATORY_CARE_PROVIDER_SITE_OTHER): Payer: Medicaid Other | Admitting: Family Medicine

## 2015-07-28 ENCOUNTER — Encounter: Payer: Self-pay | Admitting: Family Medicine

## 2015-07-28 VITALS — BP 106/64 | HR 100 | Wt 144.0 lb

## 2015-07-28 DIAGNOSIS — O99119 Other diseases of the blood and blood-forming organs and certain disorders involving the immune mechanism complicating pregnancy, unspecified trimester: Secondary | ICD-10-CM

## 2015-07-28 DIAGNOSIS — O09893 Supervision of other high risk pregnancies, third trimester: Secondary | ICD-10-CM

## 2015-07-28 DIAGNOSIS — D6851 Activated protein C resistance: Secondary | ICD-10-CM | POA: Diagnosis not present

## 2015-07-28 LAB — POCT URINALYSIS DIP (DEVICE)
BILIRUBIN URINE: NEGATIVE
Glucose, UA: NEGATIVE mg/dL
HGB URINE DIPSTICK: NEGATIVE
KETONES UR: NEGATIVE mg/dL
Nitrite: NEGATIVE
PH: 6.5 (ref 5.0–8.0)
PROTEIN: NEGATIVE mg/dL
Specific Gravity, Urine: 1.015 (ref 1.005–1.030)
Urobilinogen, UA: 0.2 mg/dL (ref 0.0–1.0)

## 2015-07-28 NOTE — Progress Notes (Signed)
Used video interpreter [phone]

## 2015-07-28 NOTE — Progress Notes (Signed)
Subjective:  Sonya Turner is a 30 y.o. [id number] at [redacted]w[redacted]d being seen today for ongoing prenatal care.  She is currently monitored for the following issues for this high-risk pregnancy and has Supervision of other high risk pregnancy, antepartum; Heterozygous factor V Leiden affecting pregnancy, antepartum (HCC); Language barrier; Limited prenatal care in second trimester; and Four previous miscarriages, affecting care of mother in second trimester, antepartum on her problem list.  Patient reports no complaints.  Contractions: Not present. Vag. Bleeding: None.  Movement: Present. Denies leaking of fluid.   The following portions of the patient's history were reviewed and updated as appropriate: allergies, current medications, past family history, past medical history, past social history, past surgical history and problem list. Problem list updated.  Objective:   Filed Vitals:   07/28/15 0942  BP: 106/64  Pulse: 100  Weight: 144 lb (65.318 kg)    Fetal Status: Fetal Heart Rate (bpm): 152 Fundal Height: 31 cm Movement: Present     General:  Alert, oriented and cooperative. Patient is in no acute distress.  Skin: Skin is warm and dry. No rash noted.   Cardiovascular: Normal heart rate noted  Respiratory: Normal respiratory effort, no problems with respiration noted  Abdomen: Soft, gravid, appropriate for gestational age. Pain/Pressure: Present     Pelvic: Vag. Bleeding: None     Cervical exam deferred        Extremities: Normal range of motion.  Edema: None  Mental Status: Normal mood and affect. Normal behavior. Normal judgment and thought content.   Urinalysis: Urine Protein: Negative Urine Glucose: Negative  Assessment and Plan:  Pregnancy: [name] at [redacted]w[redacted]d  1. Supervision of other high risk pregnancy, antepartum, third trimester Continue prenatal care.   2. Heterozygous factor V Leiden affecting pregnancy, antepartum (HCC) Continue her lovenox  Preterm labor symptoms and general  obstetric precautions including but not limited to vaginal bleeding, contractions, leaking of fluid and fetal movement were reviewed in detail with the patient. Please refer to After Visit Summary for other counseling recommendations.  Return in 2 weeks (on 08/11/2015).   Reva Bores, MD

## 2015-07-28 NOTE — Patient Instructions (Addendum)
        30   42  7  9.        .    ڡ   20     6-10 .           .      .     .      25-35   (11-16 )   .           .               .        .                 .        ().                .            .      .      ߡ  ڡ   .                .           .       .         .     .          .    ""     .      .           .      ()   .               2    36.  ߡ      .     :          .     .      .      .        .                .   36 ǡ      .              .          ǡ   ɡ  .         .    :     .   .      .       ɡ    ݡ  ϡ    .       ۡ      ۡ  .     .              :        ( ).       ء  .               .     (   ) .     ѡ            .               .    ȡ   .              .      ɡ ݡ   ɡ           .     .    5    .               .          .          .            37         .          .       ȡ    .        .     ۡ      ۡ   .              .            .        .             .       .          .     ɡ  .        .        

## 2015-07-31 ENCOUNTER — Encounter: Payer: Medicaid Other | Admitting: Family Medicine

## 2015-08-14 ENCOUNTER — Encounter: Payer: Medicaid Other | Admitting: Obstetrics & Gynecology

## 2015-08-21 ENCOUNTER — Ambulatory Visit (INDEPENDENT_AMBULATORY_CARE_PROVIDER_SITE_OTHER): Payer: Medicaid Other | Admitting: Obstetrics & Gynecology

## 2015-08-21 ENCOUNTER — Other Ambulatory Visit (HOSPITAL_COMMUNITY)
Admission: RE | Admit: 2015-08-21 | Discharge: 2015-08-21 | Disposition: A | Discharge status: Home / Self Care | Payer: Medicaid Other | Source: Ambulatory Visit | Attending: Obstetrics & Gynecology | Admitting: Obstetrics & Gynecology

## 2015-08-21 VITALS — BP 105/67 | HR 108 | Temp 97.7°F | Wt 150.0 lb

## 2015-08-21 DIAGNOSIS — Z113 Encounter for screening for infections with a predominantly sexual mode of transmission: Secondary | ICD-10-CM | POA: Diagnosis not present

## 2015-08-21 DIAGNOSIS — D6851 Activated protein C resistance: Secondary | ICD-10-CM | POA: Diagnosis not present

## 2015-08-21 DIAGNOSIS — O99119 Other diseases of the blood and blood-forming organs and certain disorders involving the immune mechanism complicating pregnancy, unspecified trimester: Secondary | ICD-10-CM | POA: Diagnosis not present

## 2015-08-21 DIAGNOSIS — O09893 Supervision of other high risk pregnancies, third trimester: Secondary | ICD-10-CM

## 2015-08-21 LAB — POCT URINALYSIS DIP (DEVICE)
Bilirubin Urine: NEGATIVE
Glucose, UA: NEGATIVE mg/dL
HGB URINE DIPSTICK: NEGATIVE
Ketones, ur: NEGATIVE mg/dL
NITRITE: NEGATIVE
PH: 5.5 (ref 5.0–8.0)
PROTEIN: NEGATIVE mg/dL
Specific Gravity, Urine: 1.02 (ref 1.005–1.030)
UROBILINOGEN UA: 0.2 mg/dL (ref 0.0–1.0)

## 2015-08-21 LAB — OB RESULTS CONSOLE GBS: GBS: NEGATIVE

## 2015-08-21 NOTE — Patient Instructions (Addendum)
Return to clinic for any obstetric concerns or go to [facility] for evaluation AREA PEDIATRIC/FAMILY PRACTICE PHYSICIANS  [facility] [address] [place], [place] [address] Phone - [phone]   Fax - [phone]  [facility] [address] [place], [place]  [address] Phone - [phone]   Fax - [phone]  [facility] [address] [place], [place]  [address] Phone - [phone]   Fax - [phone]  [facility] [address] [place], [place]  [address] Phone - [phone]   Fax - [phone]  [facility] [address] [place], [place]  [address] Phone - [phone]   Fax - [phone]  [facility] [address], [address] [place], [place]  [address] Phone - [phone]   Fax - [phone]  [facility] [address] [place], [place]  [address] Phone - [phone]   Fax - [phone]  [facility] [address] [place], [place]  [address] Phone - [phone]   Fax - [phone]  [facility] [address] [place], [place]  [address] Phone - [phone]   Fax - [phone] [facility] [address] [place], [place]  [address] Phone - [phone]   Fax - [phone]  [facility] [address] [place], [place]  [address] Phone - [phone]   Fax - [phone]  [facility] [address] [place], [place]  [address] Phone - [phone]   Fax - [phone]  [facility] [address] [place], [place]  [address] Phone - [phone]   Fax - [phone]  [facility] [address] [place], [place]  [address] Phone - [phone]  [facility] [address] [place], [place]  [address] Phone -  [phone]   Fax - [phone]  [facility] [address] [place], [place]  [address] Phone - [phone]   Fax - [phone]  [facility] [address] [place], [place]  [address] Phone - [phone]   Fax - [phone]  [facility] [address] [place], [place]  [address] Phone - [phone]   Fax - [phone]   [facility] [address] [place], [place]  [address] Phone - [phone]   Fax - [phone]  [facility] [address] [place], [place]  [address] Phone - [phone]   Fax - [phone]  [facility] [address] [place], [place]  [address] Phone - [phone]   Fax - [phone]  [facility] [address] , [place]  [address] Phone - [phone]   Fax - [phone]  [facility] [address] , [place]  [address] Phone - [phone]   Fax - [phone] [facility] [address] [place], [place]  [address] Phone - [phone]   Fax - [phone]  [facility] [address] [place], [place]  [address] Phone - [phone]   Fax - [phone]  [facility] [address] [place],   [address] Phone - [phone]   Fax - [phone]     

## 2015-08-21 NOTE — Progress Notes (Signed)
Subjective:  Sonya Turner is a 30 y.o. [id number] at [redacted]w[redacted]d being seen today for ongoing prenatal care.  She is currently monitored for the following issues for this high-risk pregnancy and has Supervision of other high risk pregnancy, antepartum; Heterozygous factor V Leiden affecting pregnancy, antepartum (HCC); Language barrier; Limited prenatal care in second trimester; and Four previous miscarriages, affecting care of mother in second trimester, antepartum on her problem list.  Arabic interpreter present.  Patient reports no complaints.  Contractions: Irritability. Vag. Bleeding: None.  Movement: Present. Denies leaking of fluid.   The following portions of the patient's history were reviewed and updated as appropriate: allergies, current medications, past family history, past medical history, past social history, past surgical history and problem list. Problem list updated.  Objective:   Filed Vitals:   08/21/15 1028  BP: 105/67  Pulse: 108  Temp: 97.7 F (36.5 C)  Weight: 150 lb (68.04 kg)    Fetal Status: Fetal Heart Rate (bpm): 147 Fundal Height: 36 cm Movement: Present  Presentation: Vertex  General:  Alert, oriented and cooperative. Patient is in no acute distress.  Skin: Skin is warm and dry. No rash noted.   Cardiovascular: Normal heart rate noted  Respiratory: Normal respiratory effort, no problems with respiration noted  Abdomen: Soft, gravid, appropriate for gestational age. Pain/Pressure: Present     Pelvic: Vag. Bleeding: None    Cervical exam:  Dilation: 2 Effacement (%): 70 Station: -3  Extremities: Normal range of motion.  Edema: Trace  Mental Status: Normal mood and affect. Normal behavior. Normal judgment and thought content.   Urinalysis: Urine Protein: Negative Urine Glucose: Negative  Assessment and Plan:  Pregnancy: M8U1324 at [redacted]w[redacted]d  1. Heterozygous factor V Leiden affecting pregnancy, antepartum (HCC) Continue Lovenox. Of note, patient did not use epidural for  previous deliveries, does not desire for this delivery.  2. Supervision of other high risk pregnancy, antepartum, third trimester - GC/Chlamydia probe amp ([place])not at [facility] - Culture, beta strep (group b only)  Term labor symptoms and general obstetric precautions including but not limited to vaginal bleeding, contractions, leaking of fluid and fetal movement were reviewed in detail with the patient. Please refer to After Visit Summary for other counseling recommendations.  Return in about 1 week (around 08/28/2015) for OB Visit.   Tereso Newcomer, MD

## 2015-08-21 NOTE — Progress Notes (Signed)
Julieanne Manson used for interpreter  Reviewed tip of week with patient

## 2015-08-22 LAB — GC/CHLAMYDIA PROBE AMP (~~LOC~~) NOT AT ARMC
Chlamydia: NEGATIVE
Neisseria Gonorrhea: NEGATIVE

## 2015-08-23 LAB — CULTURE, BETA STREP (GROUP B ONLY)

## 2015-08-28 ENCOUNTER — Ambulatory Visit (INDEPENDENT_AMBULATORY_CARE_PROVIDER_SITE_OTHER): Payer: Medicaid Other | Admitting: Family

## 2015-08-28 ENCOUNTER — Encounter: Payer: Self-pay | Admitting: Family

## 2015-08-28 VITALS — BP 107/71 | HR 113 | Temp 98.2°F | Wt 150.7 lb

## 2015-08-28 DIAGNOSIS — O99113 Other diseases of the blood and blood-forming organs and certain disorders involving the immune mechanism complicating pregnancy, third trimester: Secondary | ICD-10-CM | POA: Diagnosis not present

## 2015-08-28 DIAGNOSIS — O09893 Supervision of other high risk pregnancies, third trimester: Secondary | ICD-10-CM

## 2015-08-28 DIAGNOSIS — O99119 Other diseases of the blood and blood-forming organs and certain disorders involving the immune mechanism complicating pregnancy, unspecified trimester: Secondary | ICD-10-CM

## 2015-08-28 DIAGNOSIS — D6851 Activated protein C resistance: Secondary | ICD-10-CM | POA: Diagnosis not present

## 2015-08-28 DIAGNOSIS — Z789 Other specified health status: Secondary | ICD-10-CM

## 2015-08-28 LAB — POCT URINALYSIS DIP (DEVICE)
Bilirubin Urine: NEGATIVE
GLUCOSE, UA: NEGATIVE mg/dL
HGB URINE DIPSTICK: NEGATIVE
Ketones, ur: NEGATIVE mg/dL
NITRITE: NEGATIVE
PROTEIN: NEGATIVE mg/dL
SPECIFIC GRAVITY, URINE: 1.025 (ref 1.005–1.030)
UROBILINOGEN UA: 0.2 mg/dL (ref 0.0–1.0)
pH: 5.5 (ref 5.0–8.0)

## 2015-08-28 NOTE — Patient Instructions (Signed)
AREA PEDIATRIC/FAMILY PRACTICE PHYSICIANS  [facility] [address] [place], [place] [address] Phone - [phone]   Fax - [phone]  [facility] [address] [place], [place]  [address] Phone - [phone]   Fax - [phone]  [facility] [address] [place], [place]  [address] Phone - [phone]   Fax - [phone]  [facility] [address] [place], [place]  [address] Phone - [phone]   Fax - [phone]  [facility] [address] [place], [place]  [address] Phone - [phone]   Fax - [phone]  [facility] [address] [place], [place]  [address] Phone - [phone]   Fax - [phone]  [facility] [address] [place], [place]  [address] Phone - [phone]   Fax - [phone]  [facility] [address] [place], [place]  [address] Phone - [phone]   Fax - [phone]  [facility] [address] [place], [place]  [address] Phone - [phone]   Fax - [phone] [facility] [address] [place], [place]  [address] Phone - [phone]   Fax - [phone]  [facility] [address] [place], [place]  [address] Phone - [phone]   Fax - [phone]  [facility] [address] [place], [place]  [address] Phone - [phone]   Fax - [phone]  [facility] [address] [place], [place]  [address] Phone - [phone]   Fax - [phone]  [facility] [address] [place], [place]  [address] Phone - [phone]  [facility] [address] [place], [place]  [address] Phone - [phone]   Fax - [phone]  [facility] [address] [place], [place]  [address] Phone - [phone]   Fax - [phone]  [facility] [address] [place], [place]  [address] Phone - [phone]   Fax - [phone]  [facility] [address] [place], [place]  [address] Phone - [phone]   Fax - [phone]   [facility] [address] [place], [place]  [address] Phone - [phone]   Fax - [phone]  [facility] [address] [place], [place]  [address] Phone - [phone]   Fax - [phone]  [facility] [address] [place], [place]  [address] Phone - [phone]   Fax - [phone]  [facility] [address] [place], [place]  [address] Phone - [phone]   Fax - [phone]  [facility] [address] [place], [place]  [address] Phone - [phone]   Fax - [phone] [facility] [address] [place], [place]  [address] Phone - [phone]   Fax - [phone]  [facility] [address] [place], [place]  [address] Phone - [phone]   Fax - [phone]  [facility] [address] [place],   [address] Phone - [phone]   Fax - [phone]   

## 2015-08-28 NOTE — Progress Notes (Signed)
Used [employer] Interpreter [phone].

## 2015-08-28 NOTE — Progress Notes (Signed)
Subjective:  Sonya Turner is a 30 y.o. [id number] at 8161w3d being seen today for ongoing prenatal care.  She is currently monitored for the following issues for this high-risk pregnancy and has Supervision of other high risk pregnancy, antepartum; Heterozygous factor V Leiden affecting pregnancy, antepartum (HCC); Language barrier; Limited prenatal care in second trimester; and Four previous miscarriages, affecting care of mother in second trimester, antepartum on her problem list.  Patient reports occasional contractions and lower intermittent pelvic pressure.  Contractions: Irregular. Vag. Bleeding: None.  Movement: Present. Denies leaking of fluid.   The following portions of the patient's history were reviewed and updated as appropriate: allergies, current medications, past family history, past medical history, past social history, past surgical history and problem list. Problem list updated.  Objective:   Filed Vitals:   08/28/15 1041  BP: 107/71  Pulse: 113  Temp: 98.2 F (36.8 C)  Weight: 150 lb 11.2 oz (68.357 kg)    Fetal Status: Fetal Heart Rate (bpm): 140 Fundal Height: 37 cm Movement: Present  Presentation: Vertex  General:  Alert, oriented and cooperative. Patient is in no acute distress.  Skin: Skin is warm and dry. No rash noted.   Cardiovascular: Normal heart rate noted  Respiratory: Normal respiratory effort, no problems with respiration noted  Abdomen: Soft, gravid, appropriate for gestational age. Pain/Pressure: Present     Pelvic: Vag. Bleeding: None     Cervical exam deferred        Extremities: Normal range of motion.  Edema: Trace  Mental Status: Normal mood and affect. Normal behavior. Normal judgment and thought content.   Urinalysis: Urine Protein: Negative Urine Glucose: Negative  Assessment and Plan:  Pregnancy: [id number] at 8161w3d  1. Language barrier - Interpreter video service used  2. Supervision of other high risk pregnancy, antepartum, third trimester -  Reviewed GBS results  3. Heterozygous factor V Leiden affecting pregnancy, antepartum (HCC) - Continue Lovenox; patient does not desire epidural  Term labor symptoms and general obstetric precautions including but not limited to vaginal bleeding, contractions, leaking of fluid and fetal movement were reviewed in detail with the patient. Please refer to After Visit Summary for other counseling recommendations.  Return in about 1 week (around 09/04/2015).   Eino FarberWalidah Kennith GainN Karim, CNM

## 2015-09-06 ENCOUNTER — Inpatient Hospital Stay (HOSPITAL_COMMUNITY)
Admission: AD | Admit: 2015-09-06 | Discharge: 2015-09-08 | DRG: 775 | Disposition: A | Discharge status: Home / Self Care | Payer: Medicaid Other | Source: Ambulatory Visit | Attending: Obstetrics & Gynecology | Admitting: Obstetrics & Gynecology

## 2015-09-06 ENCOUNTER — Encounter (HOSPITAL_COMMUNITY): Payer: Self-pay

## 2015-09-06 DIAGNOSIS — IMO0001 Reserved for inherently not codable concepts without codable children: Secondary | ICD-10-CM

## 2015-09-06 DIAGNOSIS — D6851 Activated protein C resistance: Secondary | ICD-10-CM

## 2015-09-06 DIAGNOSIS — O9912 Other diseases of the blood and blood-forming organs and certain disorders involving the immune mechanism complicating childbirth: Secondary | ICD-10-CM | POA: Diagnosis present

## 2015-09-06 DIAGNOSIS — O2623 Pregnancy care for patient with recurrent pregnancy loss, third trimester: Secondary | ICD-10-CM | POA: Diagnosis present

## 2015-09-06 DIAGNOSIS — O09893 Supervision of other high risk pregnancies, third trimester: Secondary | ICD-10-CM

## 2015-09-06 DIAGNOSIS — Z7902 Long term (current) use of antithrombotics/antiplatelets: Secondary | ICD-10-CM | POA: Diagnosis not present

## 2015-09-06 DIAGNOSIS — Z3A39 39 weeks gestation of pregnancy: Secondary | ICD-10-CM

## 2015-09-06 DIAGNOSIS — O99119 Other diseases of the blood and blood-forming organs and certain disorders involving the immune mechanism complicating pregnancy, unspecified trimester: Secondary | ICD-10-CM

## 2015-09-06 DIAGNOSIS — O0932 Supervision of pregnancy with insufficient antenatal care, second trimester: Secondary | ICD-10-CM

## 2015-09-06 LAB — CBC
HCT: 41.5 % (ref 36.0–46.0)
Hemoglobin: 14.3 g/dL (ref 12.0–15.0)
MCH: 29.9 pg (ref 26.0–34.0)
MCHC: 34.5 g/dL (ref 30.0–36.0)
MCV: 86.8 fL (ref 78.0–100.0)
PLATELETS: 288 10*3/uL (ref 150–400)
RBC: 4.78 MIL/uL (ref 3.87–5.11)
RDW: 14.7 % (ref 11.5–15.5)
WBC: 9.4 10*3/uL (ref 4.0–10.5)

## 2015-09-06 LAB — TYPE AND SCREEN
ABO/RH(D): O POS
ANTIBODY SCREEN: NEGATIVE

## 2015-09-06 MED ORDER — OXYTOCIN BOLUS FROM INFUSION
500.0000 mL | INTRAVENOUS | Status: DC
  Administered 2015-09-06: 500 mL via INTRAVENOUS

## 2015-09-06 MED ORDER — OXYTOCIN 10 UNIT/ML IJ SOLN: 1.0000 m[IU]/min | INTRAVENOUS | Status: DC

## 2015-09-06 MED ORDER — FENTANYL CITRATE (PF) 100 MCG/2ML IJ SOLN
100.0000 ug | INTRAMUSCULAR | Status: DC | PRN
  Administered 2015-09-06: 100 ug via INTRAVENOUS
  Filled 2015-09-06: qty 2

## 2015-09-06 MED ORDER — DIBUCAINE 1 % RE OINT: 1.0000 "application " | TOPICAL_OINTMENT | RECTAL | Status: DC | PRN

## 2015-09-06 MED ORDER — WITCH HAZEL-GLYCERIN EX PADS: 1.0000 "application " | MEDICATED_PAD | CUTANEOUS | Status: DC | PRN

## 2015-09-06 MED ORDER — DIPHENHYDRAMINE HCL 25 MG PO CAPS: 25.0000 mg | ORAL_CAPSULE | Freq: Four times a day (QID) | ORAL | Status: DC | PRN

## 2015-09-06 MED ORDER — LANOLIN HYDROUS EX OINT: TOPICAL_OINTMENT | CUTANEOUS | Status: DC | PRN

## 2015-09-06 MED ORDER — ZOLPIDEM TARTRATE 5 MG PO TABS: 5.0000 mg | ORAL_TABLET | Freq: Every evening | ORAL | Status: DC | PRN

## 2015-09-06 MED ORDER — SENNOSIDES-DOCUSATE SODIUM 8.6-50 MG PO TABS
2.0000 | ORAL_TABLET | ORAL | Status: DC
  Administered 2015-09-06 – 2015-09-07 (×2): 2 via ORAL
  Filled 2015-09-06 (×2): qty 2

## 2015-09-06 MED ORDER — FLEET ENEMA 7-19 GM/118ML RE ENEM: 1.0000 | ENEMA | RECTAL | Status: DC | PRN

## 2015-09-06 MED ORDER — LACTATED RINGERS IV SOLN
INTRAVENOUS | Status: DC
  Administered 2015-09-06: 13:00:00 via INTRAVENOUS

## 2015-09-06 MED ORDER — LIDOCAINE HCL (PF) 1 % IJ SOLN
INTRAMUSCULAR | Status: AC
  Filled 2015-09-06: qty 30

## 2015-09-06 MED ORDER — ENOXAPARIN SODIUM 80 MG/0.8ML ~~LOC~~ SOLN
1.0000 mg/kg | Freq: Two times a day (BID) | SUBCUTANEOUS | Status: DC
  Administered 2015-09-07 – 2015-09-08 (×3): 70 mg via SUBCUTANEOUS
  Filled 2015-09-06 (×3): qty 0.8

## 2015-09-06 MED ORDER — LACTATED RINGERS IV SOLN
2.5000 [IU]/h | INTRAVENOUS | Status: DC
  Administered 2015-09-06: 2.5 [IU]/h via INTRAVENOUS

## 2015-09-06 MED ORDER — OXYCODONE-ACETAMINOPHEN 5-325 MG PO TABS: 2.0000 | ORAL_TABLET | ORAL | Status: DC | PRN

## 2015-09-06 MED ORDER — TERBUTALINE SULFATE 1 MG/ML IJ SOLN
0.2500 mg | Freq: Once | INTRAMUSCULAR | Status: DC | PRN
  Filled 2015-09-06: qty 1

## 2015-09-06 MED ORDER — SIMETHICONE 80 MG PO CHEW: 80.0000 mg | CHEWABLE_TABLET | ORAL | Status: DC | PRN

## 2015-09-06 MED ORDER — ACETAMINOPHEN 325 MG PO TABS: 650.0000 mg | ORAL_TABLET | ORAL | Status: DC | PRN

## 2015-09-06 MED ORDER — BENZOCAINE-MENTHOL 20-0.5 % EX AERO: 1.0000 "application " | INHALATION_SPRAY | CUTANEOUS | Status: DC | PRN

## 2015-09-06 MED ORDER — CITRIC ACID-SODIUM CITRATE 334-500 MG/5ML PO SOLN: 30.0000 mL | ORAL | Status: DC | PRN

## 2015-09-06 MED ORDER — LACTATED RINGERS IV SOLN: 500.0000 mL | INTRAVENOUS | Status: DC | PRN

## 2015-09-06 MED ORDER — ONDANSETRON HCL 4 MG PO TABS: 4.0000 mg | ORAL_TABLET | ORAL | Status: DC | PRN

## 2015-09-06 MED ORDER — LIDOCAINE HCL (PF) 1 % IJ SOLN
30.0000 mL | INTRAMUSCULAR | Status: DC | PRN
  Filled 2015-09-06: qty 30

## 2015-09-06 MED ORDER — PRENATAL MULTIVITAMIN CH
1.0000 | ORAL_TABLET | Freq: Every day | ORAL | Status: DC
  Administered 2015-09-07 – 2015-09-08 (×2): 1 via ORAL
  Filled 2015-09-06 (×2): qty 1

## 2015-09-06 MED ORDER — ONDANSETRON HCL 4 MG/2ML IJ SOLN: 4.0000 mg | INTRAMUSCULAR | Status: DC | PRN

## 2015-09-06 MED ORDER — OXYTOCIN 10 UNIT/ML IJ SOLN
INTRAMUSCULAR | Status: AC
  Filled 2015-09-06: qty 4

## 2015-09-06 MED ORDER — ONDANSETRON HCL 4 MG/2ML IJ SOLN: 4.0000 mg | Freq: Four times a day (QID) | INTRAMUSCULAR | Status: DC | PRN

## 2015-09-06 MED ORDER — OXYCODONE-ACETAMINOPHEN 5-325 MG PO TABS
1.0000 | ORAL_TABLET | ORAL | Status: DC | PRN
  Administered 2015-09-06: 1 via ORAL
  Filled 2015-09-06: qty 1

## 2015-09-06 MED ORDER — IBUPROFEN 600 MG PO TABS
600.0000 mg | ORAL_TABLET | Freq: Four times a day (QID) | ORAL | Status: DC
Start: 2015-09-06 — End: 2015-09-08
  Administered 2015-09-06 – 2015-09-08 (×8): 600 mg via ORAL
  Filled 2015-09-06 (×8): qty 1

## 2015-09-06 MED ORDER — TETANUS-DIPHTH-ACELL PERTUSSIS 5-2.5-18.5 LF-MCG/0.5 IM SUSP: 0.5000 mL | Freq: Once | INTRAMUSCULAR | Status: DC

## 2015-09-06 NOTE — H&P (Signed)
LABOR AND DELIVERY ADMISSION HISTORY AND PHYSICAL NOTE  Sonya Turner is a 30 y.o. female [phone] with IUP at 10423w5d by 30 wk u/s presenting for painful contractions. Began last night, more painful and frequent this morning.   She reports positive fetal movement. She denies leakage of fluid or vaginal bleeding.  Prenatal History/Complications:  Past Medical History: Past Medical History  Diagnosis Date  . Medical history non-contributory   . Factor V Leiden [facility](HCC)     Past Surgical History: Past Surgical History  Procedure Laterality Date  . Appendectomy      Obstetrical History: OB History    Gravida Para Term Preterm AB TAB SAB Ectopic Multiple Living   8 3 3  4  4   3       Social History: Social History   Social History  . Marital Status: Married    Spouse Name: N/A  . Number of Children: N/A  . Years of Education: N/A   Social History Main Topics  . Smoking status: Never Smoker   . Smokeless tobacco: Never Used  . Alcohol Use: No  . Drug Use: No  . Sexual Activity: No   Other Topics Concern  . None   Social History Narrative    Family History: No family history on file.  Allergies: No Known Allergies  Prescriptions prior to admission  Medication Sig Dispense Refill Last Dose  . enoxaparin (LOVENOX) 40 MG/0.4ML injection Inject 0.4 mLs (40 mg total) into the skin daily. 30 Syringe 6 Taking  . Prenat w/o A Vit-FeFum-FePo-FA (CONCEPT OB) 130-92.4-1 MG CAPS Take 1 tablet by mouth daily. 30 capsule 12 Taking     Review of Systems   All systems reviewed and negative except as stated in HPI  Blood pressure 134/72, pulse 120, temperature 98.7 F (37.1 C), temperature source Oral, resp. rate 18, weight 151 lb (68.493 kg), last menstrual period 11/25/2014. General appearance: alert, cooperative, appears stated age and mild distress Lungs: clear to auscultation bilaterally Heart: regular rate and rhythm Abdomen: soft, non-tender; bowel sounds  normal Extremities: No calf swelling or tenderness Presentation: cephalic per rn exam Fetal monitoring: 145/mod/+a/-d Uterine activity: q 3-5 min  Dilation: 5.5 Effacement (%): 80 Station: -2 Exam by:: Dorisann FramesAmanda Jones RN   Prenatal labs: ABO, Rh: --/--/O POS, O POS (12/08 1542) Antibody: NEG (12/08 1542) Rubella: !Error!imm RPR: Non Reactive (12/08 1542)  HBsAg: Negative (12/08 1542)  HIV: Non Reactive (12/08 1542)  GBS: Negative (03/02 0000)  1 hr Glucola: 144, 3 hr 79/148/162/181 Genetic screening:  Too late Anatomy US:  wnl  Prenatal Transfer Tool  Maternal Diabetes: No Genetic Screening: Declined Maternal Ultrasounds/Referrals: Normal Fetal Ultrasounds or other Referrals:  None Maternal Substance Abuse:  No Significant Maternal Medications:  Meds include: Other: lovenox Significant Maternal Lab Results: Lab values include: Group B Strep negative  No results found for this or any previous visit (from the past 24 hour(s)).  Patient Active Problem List   Diagnosis Date Noted  . Supervision of other high risk pregnancy, antepartum 05/29/2015  . Heterozygous factor V Leiden affecting pregnancy, antepartum (HCC) 05/29/2015  . Language barrier 05/29/2015  . Limited prenatal care in second trimester 05/29/2015  . Four previous miscarriages, affecting care of mother in second trimester, antepartum 05/29/2015    Assessment: Sonya Turner is a 30 y.o. [id number] at 4223w5d here for active labor  #Labor: Expectant #Pain: Iv narcotics #FWB:  cat 1 #ID:  gbs neg #MOF: br/bot #MOC: nexplanon or iud #Circ:  N/a #  FVL heterozygote: hx 3 miscarriages. Also factor s deficient. Prophylaxes this pregnancy. Does not want epidural. No personal or family hx dvt. Will plan for 6 wks anticoagulation PP.  [name] 09/06/2015, 1:42 PM

## 2015-09-06 NOTE — Lactation Note (Signed)
This note was copied from a baby's chart. Lactation Consultation Note Initial visit at 6 hours of age.  Mom is a refugee from [place] and has been here for about 8 months.  Mom has friend in room helping as support person and translated for mom in Arabic.  Mom understands some english.  Mom is experienced with older children breastfeeding well and denies concerns.  Mom feels good about having enough milk for baby and is able to hand express with colostrum visible, per mom.  Mom reports minimal pain with latch, LC advised to make sure baby has mouth wide open for latch and to hold baby close to breast.  [facility]    [facility] resources given and discussed.  Encouraged to feed with early cues on demand.  Early newborn behavior discussed.  Mom to call for assist as needed.     Patient Name: Sonya Turner Today's Date: 09/06/2015 Reason for consult: Initial assessment   Maternal Data Has patient been taught Hand Expression?: Yes Does the patient have breastfeeding experience prior to this delivery?: Yes  Feeding Feeding Type: Breast Fed Length of feed: 10 min  LATCH Score/Interventions                Intervention(s): Breastfeeding basics reviewed     Lactation Tools Discussed/Used     Consult Status Consult Status: Follow-up Date: 09/07/15 Follow-up type: In-patient    Jarika Robben, Arvella MerlesJana Lynn 09/06/2015, 10:35 PM

## 2015-09-06 NOTE — Progress Notes (Signed)
Labor Progress Note  Sonya Turner is a 30 y.o. [id number] at 4345w5d  admitted for active labor  S: Doing well. Breathing through painful contractions. Still does not want epidural.   O:  BP 115/65 mmHg  Pulse 114  Temp(Src) 98 F (36.7 C) (Oral)  Resp 18  Ht 5\' 2"  (1.575 m)  Wt 151 lb (68.493 kg)  BMI 27.61 kg/m2  LMP 11/25/2014 (Exact Date)  FHT:  FHR: 135 bpm, variability: moderate,  accelerations:  Present,  decelerations:  Absent UC:   regular, every 6-8 minutes SVE:   Dilation: 7 Effacement (%): 80 Station: -2 Exam by:: Dr. Doroteo GlassmanPhelps AROM: @1552  - clear  Labs: Lab Results  Component Value Date   WBC 9.4 09/06/2015   HGB 14.3 09/06/2015   HCT 41.5 09/06/2015   MCV 86.8 09/06/2015   PLT 288 09/06/2015    Assessment / Plan: 30 y.o. [id number] 7145w5d in active labor Spontaneous labor, progressing normally  Labor: Progressing normally; AROM, will see if this produces adequate contractions. If not will start pitocin in an hour. Fetal Wellbeing:  Category I Pain Control:  IV pain meds Anticipated MOD:  NSVD  Expectant management   Caryl AdaJazma Kamden Stanislaw, DO 09/06/2015, 3:55 PM PGY-2, [facility]

## 2015-09-06 NOTE — MAU Note (Signed)
Contractions since yesterday 

## 2015-09-07 LAB — CBC
HCT: 37.4 % (ref 36.0–46.0)
HEMOGLOBIN: 12.7 g/dL (ref 12.0–15.0)
MCH: 29.4 pg (ref 26.0–34.0)
MCHC: 34 g/dL (ref 30.0–36.0)
MCV: 86.6 fL (ref 78.0–100.0)
PLATELETS: 266 10*3/uL (ref 150–400)
RBC: 4.32 MIL/uL (ref 3.87–5.11)
RDW: 14.7 % (ref 11.5–15.5)
WBC: 9 10*3/uL (ref 4.0–10.5)

## 2015-09-07 LAB — RPR: RPR: NONREACTIVE

## 2015-09-07 MED ORDER — ENOXAPARIN SODIUM 150 MG/ML ~~LOC~~ SOLN: 1.0000 mg/kg | Freq: Two times a day (BID) | SUBCUTANEOUS | Status: DC

## 2015-09-07 MED ORDER — IBUPROFEN 600 MG PO TABS: 600.0000 mg | ORAL_TABLET | Freq: Four times a day (QID) | ORAL | Status: DC | PRN

## 2015-09-07 NOTE — Discharge Summary (Signed)
OB Discharge Summary     Patient Name: Sonya Turner DOB: Apr 14, 1986 MRN: [id number]  Date of admission: 09/06/2015 Delivering MD: Pincus LargePHELPS, JAZMA Y   Date of discharge: 09/07/2015  Admitting diagnosis: PREG Intrauterine pregnancy: 2473w5d     Secondary diagnosis:  Active Problems:   Active labor at term   NSVD (normal spontaneous vaginal delivery)  Additional problems: Factor V Leiden     Discharge diagnosis: Term Pregnancy Delivered                                                                                                Post partum procedures:none  Augmentation: AROM  Complications: None  Hospital course:  Onset of Labor With Vaginal Delivery     30 y.o. yo [id number] at 4373w5d was admitted in Active Labor on 09/06/2015. Patient had an uncomplicated labor course as follows:  Membrane Rupture Time/Date: 3:52 PM ,09/06/2015   Intrapartum Procedures: Episiotomy: None [1]                                         Lacerations:  Perineal [11];None [1]  Patient had a delivery of a Viable infant. 09/06/2015  Information for the patient's newborn:  Posey ProntoMdalal, Girl Galaxy [[id number]][[id number]]  Delivery Method: Vag-Spont    Pateint had an uncomplicated postpartum course.  She is ambulating, tolerating a regular diet, passing flatus, and urinating well. Patient is discharged home in stable condition on 09/07/2015. Will continue Lovenox 70mg  BID x 6wks.    Physical exam  Filed Vitals:   09/06/15 1820 09/06/15 1935 09/06/15 2330 09/07/15 0619  BP: 105/56 101/46 103/59 81/43  Pulse: 82 89 79 79  Temp: 97.9 F (36.6 C) 98.4 F (36.9 C) 98.2 F (36.8 C) 98.4 F (36.9 C)  TempSrc: Oral Oral Oral Oral  Resp: 18 18 18 18   Height:      Weight:      SpO2:  97%     General: alert and cooperative Lochia: appropriate Uterine Fundus: firm Incision: N/A DVT Evaluation: No evidence of DVT seen on physical exam. Labs: Lab Results  Component Value Date   WBC 9.0 09/07/2015   HGB 12.7 09/07/2015   HCT  37.4 09/07/2015   MCV 86.6 09/07/2015   PLT 266 09/07/2015   No flowsheet data found.  Discharge instruction: per After Visit Summary and "Baby and Me Booklet".  After visit meds:    Medication List    STOP taking these medications        enoxaparin 40 MG/0.4ML injection  Commonly known as:  LOVENOX  Replaced by:  enoxaparin 150 MG/ML injection      TAKE these medications        CONCEPT OB 130-92.4-1 MG Caps  Take 1 tablet by mouth daily.     enoxaparin 150 MG/ML injection  Commonly known as:  LOVENOX  Inject 0.46 mLs (70 mg total) into the skin every 12 (twelve) hours.     ibuprofen 600 MG tablet  Commonly known as:  ADVIL,MOTRIN  Take 1 tablet (600 mg total) by mouth every 6 (six) hours as needed.        Diet: routine diet  Activity: Advance as tolerated. Pelvic rest for 6 weeks.   Outpatient follow up:6 weeks  Follow up Visit:No Follow-up on file.  Postpartum contraception: LARC- unsure type  Newborn Data: Live born female  Birth Weight: 7 lb 5.3 oz (3325 g) APGAR: 9, 9  Baby Feeding: Bottle and Breast Disposition:home with mother   09/07/2015 Cam Hai, CNM  9:00 AM

## 2015-09-07 NOTE — Progress Notes (Signed)
PPD#1 Subjective: Sonya Turner is a 30 y.o. [id number] PPD#1 s/p SVD at 7070w5d, Factor V Leiden heterozygous receiving Lovenox for prophylaxis. Pt is eating, drinking, voiding, ambulating without problems. She has not had a bowel movement. Her pain is controlled with ibuprofen. Lochia is appropriate, uterus is firm, no calf tenderness. She plans on breastfeeding and bottle feeding. She is considering Nexplanon vs. IUD for birth control. She speaks Arabic and has a friend in the room who acts as a Nurse, learning disabilitytranslator.  Objective: BP 81/43 mmHg  Pulse 79  Temp(Src) 98.4 F (36.9 C) (Oral)  Resp 18  Ht 5\' 2"  (1.575 m)  Wt 68.493 kg (151 lb)  BMI 27.61 kg/m2  SpO2 97%  LMP 11/25/2014 (Exact Date)  Breastfeeding? Unknown    Gen: Alert and oriented CV: RRR Pulm: CTAB Abdom: Uterus firm Extremities: No swelling, calf tenderness. Negative Homan's sign.  Labs: Lab Results  Component Value Date   WBC 9.0 09/07/2015   HGB 12.7 09/07/2015   HCT 37.4 09/07/2015   MCV 86.6 09/07/2015   PLT 266 09/07/2015   Assessment Sonya Turner is a 30 y.o. [id number] PPD#1 s/p SVD at 5970w5d, doing well.   Plan: -Continue lovenox prophylaxis 70 mg bid for 6 weeks PP -Discharge home today  Felton ClintonKali Quashon Jesus, Med Student  09/07/2015, 7:50 AM

## 2015-09-08 ENCOUNTER — Encounter: Payer: Medicaid Other | Admitting: Family Medicine

## 2015-09-08 MED ORDER — ENOXAPARIN SODIUM 40 MG/0.4ML ~~LOC~~ SOLN
40.0000 mg | SUBCUTANEOUS | Status: DC
  Filled 2015-09-08: qty 0.4

## 2015-09-08 MED ORDER — ENOXAPARIN SODIUM 40 MG/0.4ML ~~LOC~~ SOLN: 40.0000 mg | SUBCUTANEOUS | Status: DC

## 2015-09-08 NOTE — Discharge Instructions (Signed)
You will need to take Lovenox 40 mg daily for the next 6 weeks.    Vaginal Delivery, Care After Refer to this sheet in the next few weeks. These discharge instructions provide you with information on caring for yourself after delivery. Your health care provider may also give you specific instructions. Your treatment has been planned according to the most current medical practices available, but problems sometimes occur. Call your health care provider if you have any problems or questions after you go home. HOME CARE INSTRUCTIONS  Take over-the-counter or prescription medicines only as directed by your health care provider or pharmacist.  Do not drink alcohol, especially if you are breastfeeding or taking medicine to relieve pain.  Do not chew or smoke tobacco.  Do not use illegal drugs.  Continue to use good perineal care. Good perineal care includes:  Wiping your perineum from front to back.  Keeping your perineum clean.  Do not use tampons or douche until your health care provider says it is okay.  Shower, wash your hair, and take tub baths as directed by your health care provider.  Wear a well-fitting bra that provides breast support.  Eat healthy foods.  Drink enough fluids to keep your urine clear or pale yellow.  Eat high-fiber foods such as whole grain cereals and breads, brown rice, beans, and fresh fruits and vegetables every day. These foods may help prevent or relieve constipation.  Follow your health care provider's recommendations regarding resumption of activities such as climbing stairs, driving, lifting, exercising, or traveling.  Talk to your health care provider about resuming sexual activities. Resumption of sexual activities is dependent upon your risk of infection, your rate of healing, and your comfort and desire to resume sexual activity.  Try to have someone help you with your household activities and your newborn for at least a few days after you leave  the hospital.  Rest as much as possible. Try to rest or take a nap when your newborn is sleeping.  Increase your activities gradually.  Keep all of your scheduled postpartum appointments. It is very important to keep your scheduled follow-up appointments. At these appointments, your health care provider will be checking to make sure that you are healing physically and emotionally. SEEK MEDICAL CARE IF:   You are passing large clots from your vagina. Save any clots to show your health care provider.  You have a foul smelling discharge from your vagina.  You have trouble urinating.  You are urinating frequently.  You have pain when you urinate.  You have a change in your bowel movements.  You have increasing redness, pain, or swelling near your vaginal incision (episiotomy) or vaginal tear.  You have pus draining from your episiotomy or vaginal tear.  Your episiotomy or vaginal tear is separating.  You have painful, hard, or reddened breasts.  You have a severe headache.  You have blurred vision or see spots.  You feel sad or depressed.  You have thoughts of hurting yourself or your newborn.  You have questions about your care, the care of your newborn, or medicines.  You are dizzy or light-headed.  You have a rash.  You have nausea or vomiting.  You were breastfeeding and have not had a menstrual period within 12 weeks after you stopped breastfeeding.  You are not breastfeeding and have not had a menstrual period by the 12th week after delivery.  You have a fever. SEEK IMMEDIATE MEDICAL CARE IF:   You have persistent  pain.  You have chest pain.  You have shortness of breath.  You faint.  You have leg pain.  You have stomach pain.  Your vaginal bleeding saturates two or more sanitary pads in 1 hour.   This information is not intended to replace advice given to you by your health care provider. Make sure you discuss any questions you have with your  health care provider.   Document Released: 06/04/2000 Document Revised: 02/26/2015 Document Reviewed: 02/02/2012 Elsevier Interactive Patient Education [employer]2016 Elsevier Inc.

## 2015-09-08 NOTE — Lactation Note (Addendum)
This note was copied from a baby's chart. Lactation Consultation Note  Patient Name: Sonya Turner Today's Date: 09/08/2015 Reason for consult: Follow-up assessment;Breast/nipple pain   Follow up with Exp BF mom of 43 hour old infant. Spoke with mom through friend at bedside that interpreted for us.  Infant with 7 BF for 10-60 minutes, 3 voids and 1 stool, and 3 emesis in last 24 hours. Infant weight 6 lb 14 oz with weight loss of 6% since birth. LATCH Scores 9 by bedside RN's.   Mom reports her breast are feeling fuller. She reports that she is having pain with initial latch that improves with feeding. Mom denies problems or concerns with BF. Enc mom to feed infant 8-12 x in 24 hours at first feeding cues and to awaken infant as needed with feeding. Nipple Care reviewed.   Reviewed all BF information in Taking Care of Baby and Me Booklet. Reviewed Engorgement prevention/treatment/comfort pumping. Manual pump given to mom with instructions for use and cleaning.  Reviewed I/O and enc mom to maintain feeding log and take to Ped visit and WIC visit.   [facility] and is aware to call and make f/u appt.   Reviewed LC Brochure, mom aware of OP Services, BF Support Groups and LC Phone #. Enc mom to call with questions/concerns.    Maternal Data Formula Feeding for Exclusion: No Has patient been taught Hand Expression?: Yes Does the patient have breastfeeding experience prior to this delivery?: Yes  Feeding    LATCH Score/Interventions                      Lactation Tools Discussed/Used WIC Program: Yes Pump Review: Setup, frequency, and cleaning;Milk Storage   Consult Status Consult Status: Complete Follow-up type: Call as needed    Ed BlalockSharon S Darlette Dubow 09/08/2015, 11:39 AM

## 2015-09-08 NOTE — Progress Notes (Signed)
PPD#2 Subjective: Sihaam Posey ProntoMdalal is a 30 y.o. [id number] PPD#2 s/p SVD at 4299w5d, Factor V Leiden heterozygous receiving Lovenox for prophylaxis. Patient states that she had severe, unilateral pain in her leg early in this pregnancy, and was told after the pain subsided that she experienced clotting. No documentation of this clotting, patient was in [place] prior to reloc[place] in the [place].  Patient kept extra day, as she is a new refugee and needs to establish post-hospital care for herself and four children. Pt is eating, drinking, voiding, ambulating without problems. She had a bowel movement yesterday. Her pain is controlled with ibuprofen. Lochia is appropriate, passage of small 1cm clots. Uterus is firm, no calf tenderness. She plans on breastfeeding and bottle feeding. Feels some cramping rated 5/10 with breasfeeding. She would like a nexplanon. She speaks Arabic and has a friend in the room who acts as a Nurse, learning disabilitytranslator.  Objective: Blood pressure 96/53, pulse 71, temperature 98 F (36.7 C), temperature source Oral, resp. rate 16, height 5\' 2"  (1.575 m), weight 68.493 kg (151 lb), last menstrual period 11/25/2014, SpO2 100 %    Gen: Alert and oriented CV: RRR Pulm: CTAB Abdom: Uterus firm, nontender. U-1. Extremities: No swelling, calf tenderness. Negative Homan's sign.  Labs:  CBC    Component Value Date/Time   WBC 9.0 09/07/2015 0535   RBC 4.32 09/07/2015 0535   HGB 12.7 09/07/2015 0535   HCT 37.4 09/07/2015 0535   PLT 266 09/07/2015 0535   MCV 86.6 09/07/2015 0535   MCH 29.4 09/07/2015 0535   MCHC 34.0 09/07/2015 0535   RDW 14.7 09/07/2015 0535   Assessment  Toryn Pella is 30 y.o. [id number] PPD#2 s/p SVD. Doing well.  Plan -lovenox 40 units qd, for 6 weeks -discharge today after establishing care for mother and children.

## 2015-09-08 NOTE — Progress Notes (Signed)
UR chart review completed.  

## 2015-10-15 ENCOUNTER — Ambulatory Visit (INDEPENDENT_AMBULATORY_CARE_PROVIDER_SITE_OTHER): Payer: Medicaid Other | Admitting: Family

## 2015-10-15 VITALS — BP 107/62 | HR 75 | Temp 98.4°F | Resp 18 | Wt 134.6 lb

## 2015-10-15 DIAGNOSIS — M545 Low back pain: Secondary | ICD-10-CM

## 2015-10-15 MED ORDER — CYCLOBENZAPRINE HCL 10 MG PO TABS: 10.0000 mg | ORAL_TABLET | Freq: Three times a day (TID) | ORAL | Status: DC | PRN

## 2015-10-15 MED ORDER — PRENATAL VITAMINS 0.8 MG PO TABS: 1.0000 | ORAL_TABLET | Freq: Every day | ORAL | Status: DC

## 2015-10-15 NOTE — Progress Notes (Signed)
     HPI Review of Systems    Physical Exam    Sonya Turner is a 30 y.o. female who presents for a postpartum visit. She is 6 weeks postpartum following a spontaneous vaginal delivery. I have fully reviewed the prenatal and intrapartum course. The delivery was at 39 gestational weeks. Outcome: spontaneous vaginal delivery. Anesthesia: none. Postpartum course has been complicated by lower back pain and front thigh pain since pain. Baby's course has been unremarkable. Baby is feeding by breast. Bleeding no bleeding.  Bled 15 days after delivery. Bowel function is normal. Bladder function is normal. Patient is not sexually active. Contraception method is none. Plans to obtain either Mirena or Nexplanon.  Postpartum depression screening: negative.  The following portions of the patient's history were reviewed and updated as appropriate: allergies, current medications, past family history, past medical history, past social history, past surgical history and problem list.  Review of Systems Pertinent items are noted in HPI.   Objective:    BP 107/62 mmHg  Pulse 75  Temp(Src) 98.4 F (36.9 C) (Oral)  Resp 18  Wt 134 lb 9.6 oz (61.054 kg)  Breastfeeding? Yes        General:  alert, cooperative and appears stated age   Breasts:  inspection negative, no nipple discharge or bleeding, no masses or nodularity palpable  Lungs: clear to auscultation bilaterally  Heart:  regular rate and rhythm, S1, S2 normal, no murmur, click, rub or gallop  Abdomen: soft, non-tender; bowel sounds normal; no masses,  no organomegaly  Pelvic exam not indicated - not bleeding, no pain at vaginal area Assessment:     Normal postpartum exam. Pap smear not done at today's visit.  Unable to stay due to schedule.  Will obtain when return for birth control  Plan:    1. Contraception: Mirena or Nexplanon.  Have to wait until 40 days after delivery due to Islamic culture. 2. RX Flexeril for back pain 3. Follow up in: 2  weeks or as needed.    Eino FarberWalidah Kennith GainN Karim, CNM

## 2015-10-15 NOTE — Progress Notes (Deleted)
   Subjective:    Patient ID: Sonya Turner, female    DOB: March 18, 1986, 30 y.o.   MRN: [id number]  HPI    Review of Systems     Objective:   Physical Exam        Assessment & Plan:   Subjective:     Sonya Turner is a 30 y.o. female who presents for a postpartum visit. She is 6 weeks postpartum following a spontaneous vaginal delivery. I have fully reviewed the prenatal and intrapartum course. The delivery was at 39 gestational weeks. Outcome: spontaneous vaginal delivery. Anesthesia: none. Postpartum course has been complicated by lower back pain and front thigh pain since pain. Baby's course has been unremarkable. Baby is feeding by breast. Bleeding no bleeding.  Bled 15 days after delivery. Bowel function is normal. Bladder function is normal. Patient is not sexually active. Contraception method is none. Plans to obtain either Mirena or Nexplanon.  Postpartum depression screening: negative.  The following portions of the patient's history were reviewed and updated as appropriate: allergies, current medications, past family history, past medical history, past social history, past surgical history and problem list.  Review of Systems Pertinent items are noted in HPI.   Objective:    BP 107/62 mmHg  Pulse 75  Temp(Src) 98.4 F (36.9 C) (Oral)  Resp 18  Wt 134 lb 9.6 oz (61.054 kg)  Breastfeeding? Yes        General:  alert, cooperative and appears stated age   Breasts:  inspection negative, no nipple discharge or bleeding, no masses or nodularity palpable  Lungs: clear to auscultation bilaterally  Heart:  regular rate and rhythm, S1, S2 normal, no murmur, click, rub or gallop  Abdomen: soft, non-tender; bowel sounds normal; no masses,  no organomegaly  Pelvic exam not indicated - not bleeding, no pain at vaginal area Assessment:     Normal postpartum exam. Pap smear not done at today's visit.  Unable to stay due to schedule.  Will obtain when return for birth control  Plan:      1. Contraception: Mirena or Nexplanon.  Have to wait until 40 days after delivery due to Islamic culture. 2. RX Flexeril for back pain 3. Follow up in: 2 weeks or as needed.    Eino FarberWalidah Kennith GainN Karim, CNM

## 2016-03-30 ENCOUNTER — Inpatient Hospital Stay (HOSPITAL_COMMUNITY)
Admission: AD | Admit: 2016-03-30 | Discharge: 2016-03-30 | Disposition: A | Discharge status: Home / Self Care | Payer: Medicaid Other | Source: Ambulatory Visit | Attending: Obstetrics and Gynecology | Admitting: Obstetrics and Gynecology

## 2016-03-30 ENCOUNTER — Encounter (HOSPITAL_COMMUNITY): Payer: Self-pay

## 2016-03-30 DIAGNOSIS — D6851 Activated protein C resistance: Secondary | ICD-10-CM | POA: Diagnosis not present

## 2016-03-30 DIAGNOSIS — N898 Other specified noninflammatory disorders of vagina: Secondary | ICD-10-CM | POA: Diagnosis not present

## 2016-03-30 DIAGNOSIS — R102 Pelvic and perineal pain: Secondary | ICD-10-CM | POA: Diagnosis not present

## 2016-03-30 DIAGNOSIS — R109 Unspecified abdominal pain: Secondary | ICD-10-CM | POA: Diagnosis present

## 2016-03-30 LAB — WET PREP, GENITAL
CLUE CELLS WET PREP: NONE SEEN
SPERM: NONE SEEN
TRICH WET PREP: NONE SEEN
Yeast Wet Prep HPF POC: NONE SEEN

## 2016-03-30 LAB — URINALYSIS, ROUTINE W REFLEX MICROSCOPIC
Bilirubin Urine: NEGATIVE
GLUCOSE, UA: NEGATIVE mg/dL
HGB URINE DIPSTICK: NEGATIVE
Ketones, ur: NEGATIVE mg/dL
LEUKOCYTES UA: NEGATIVE
Nitrite: NEGATIVE
PH: 5.5 (ref 5.0–8.0)
PROTEIN: NEGATIVE mg/dL
Specific Gravity, Urine: >1.03 — ABNORMAL HIGH (ref 1.005–1.030)

## 2016-03-30 LAB — POCT PREGNANCY, URINE: Preg Test, Ur: NEGATIVE

## 2016-03-30 NOTE — MAU Note (Signed)
Pt has had abdominal pain for the last week as well as her back.  It is in her middle lower abdomen.  Denies vaginal bleeding.  Has not taken any HPT

## 2016-03-30 NOTE — MAU Provider Note (Signed)
History     CSN: [id number]  Arrival date and time: 03/30/16 1254   First Provider Initiated Contact with Patient 03/30/16 1541      Chief Complaint  Patient presents with  . Abdominal Pain   Non-pregnant female here with c/o intermittent uterine cramping x1.5 weeks. She did not take any OTC meds for it. She is breastfeeding and has not had menses. She reports white/yellow vaginal discharge x1.5 weeks, no itching or odor. She denies fever. No constipation or diarrhea. No N/V.     Past Medical History:  Diagnosis Date  . Factor V Leiden (HCC)   . Medical history non-contributory     Past Surgical History:  Procedure Laterality Date  . APPENDECTOMY      History reviewed. No pertinent family history.  Social History  Substance Use Topics  . Smoking status: Never Smoker  . Smokeless tobacco: Never Used  . Alcohol use No    Allergies: No Known Allergies  Prescriptions Prior to Admission  Medication Sig Dispense Refill Last Dose  . cyclobenzaprine (FLEXERIL) 10 MG tablet Take 1 tablet (10 mg total) by mouth every 8 (eight) hours as needed for muscle spasms. 30 tablet 1   . enoxaparin (LOVENOX) 40 MG/0.4ML injection Inject 0.4 mLs (40 mg total) into the skin daily. 30 Syringe 6 Taking  . Prenat w/o A Vit-FeFum-FePo-FA (CONCEPT OB) 130-92.4-1 MG CAPS Take 1 tablet by mouth daily. (Patient not taking: Reported on 10/15/2015) 30 capsule 12 Not Taking  . Prenatal Multivit-Min-Fe-FA (PRENATAL VITAMINS) 0.8 MG tablet Take 1 tablet by mouth daily. 30 tablet 12     Review of Systems  Constitutional: Negative.   Gastrointestinal: Positive for abdominal pain.  Genitourinary: Negative.    Physical Exam   Blood pressure 109/67, pulse 99, temperature 97.9 F (36.6 C), temperature source Oral, resp. rate 16, currently breastfeeding.  Physical Exam  Constitutional: She is oriented to person, place, and time. She appears well-developed and well-nourished.  HENT:  Head:  Normocephalic and atraumatic.  Neck: Normal range of motion. Neck supple.  Cardiovascular: Normal rate.   Respiratory: Effort normal.  GI: Soft. She exhibits no distension and no mass. There is no tenderness. There is no rebound and no guarding.  Genitourinary:  Genitourinary Comments: External: no lesions Vagina: rugated, parous, thin white discharge Uterus: non enlarged, anteverted, non tender, no CMT Adnexae: no masses, no tenderness left, no tenderness right   Musculoskeletal: Normal range of motion.  Neurological: She is alert and oriented to person, place, and time.  Skin: Skin is warm and dry.  Psychiatric: She has a normal mood and affect.   Results for orders placed or performed during the hospital encounter of 03/30/16 (from the past 24 hour(s))  Urinalysis, Routine w reflex microscopic (not at [facility])     Status: Abnormal   Collection Time: 03/30/16  1:25 PM  Result Value Ref Range   Color, Urine YELLOW YELLOW   APPearance CLEAR CLEAR   Specific Gravity, Urine >1.030 (H) 1.005 - 1.030   pH 5.5 5.0 - 8.0   Glucose, UA NEGATIVE NEGATIVE mg/dL   Hgb urine dipstick NEGATIVE NEGATIVE   Bilirubin Urine NEGATIVE NEGATIVE   Ketones, ur NEGATIVE NEGATIVE mg/dL   Protein, ur NEGATIVE NEGATIVE mg/dL   Nitrite NEGATIVE NEGATIVE   Leukocytes, UA NEGATIVE NEGATIVE  Pregnancy, urine POC     Status: None   Collection Time: 03/30/16  3:20 PM  Result Value Ref Range   Preg Test, Ur NEGATIVE NEGATIVE  Wet  prep, genital     Status: Abnormal   Collection Time: 03/30/16  3:40 PM  Result Value Ref Range   Yeast Wet Prep HPF POC NONE SEEN NONE SEEN   Trich, Wet Prep NONE SEEN NONE SEEN   Clue Cells Wet Prep HPF POC NONE SEEN NONE SEEN   WBC, Wet Prep HPF POC FEW (A) NONE SEEN   Sperm NONE SEEN     MAU Course  Procedures  MDM Labs ordered and reviewed. No evidence of acute pelvic process, vaginal infection, or UTI. Cramping may be r/t impending menses. Pt does not want to stay for  results. Stable for discharge home. Will call pt if anything abnormal.  Assessment and Plan   1. Pelvic pain   2. Vaginal discharge    Discharge home Ibuprofen 600 mg po q6 hrs prn  Follow up in [facility] if pain persists or worsens   Donette Larry, CNM 03/30/2016, 3:53 PM

## 2016-03-31 LAB — GC/CHLAMYDIA PROBE AMP (~~LOC~~) NOT AT ARMC
CHLAMYDIA, DNA PROBE: NEGATIVE
Neisseria Gonorrhea: NEGATIVE

## 2016-04-22 ENCOUNTER — Ambulatory Visit: Payer: Medicaid Other | Admitting: Family Medicine

## 2016-05-25 IMAGING — US US MFM OB COMP +14 WKS
1 series · 14 of 28 positions shown · non-contrast
Comparison: none

[Series 1: us mfm ob comp +14 wks · 64 acquisitions, 14 frames shown]
[im 3/64]
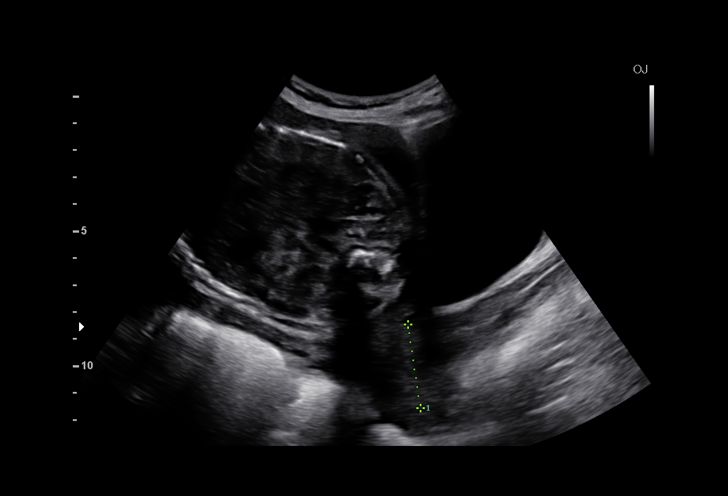
[im 8/64]
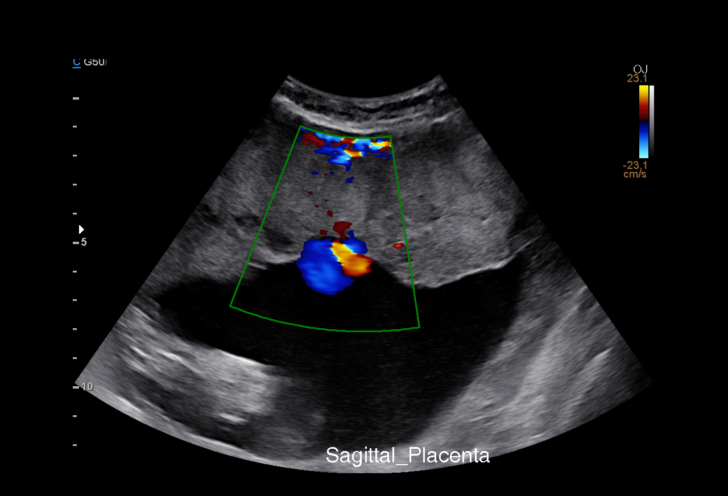
[im 12/64]
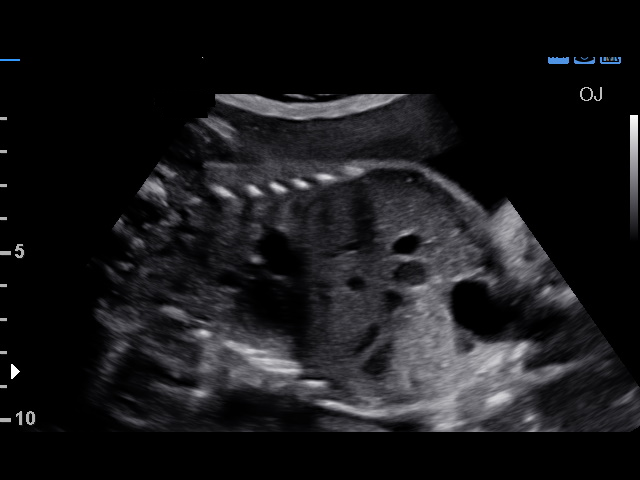
[im 17/64]
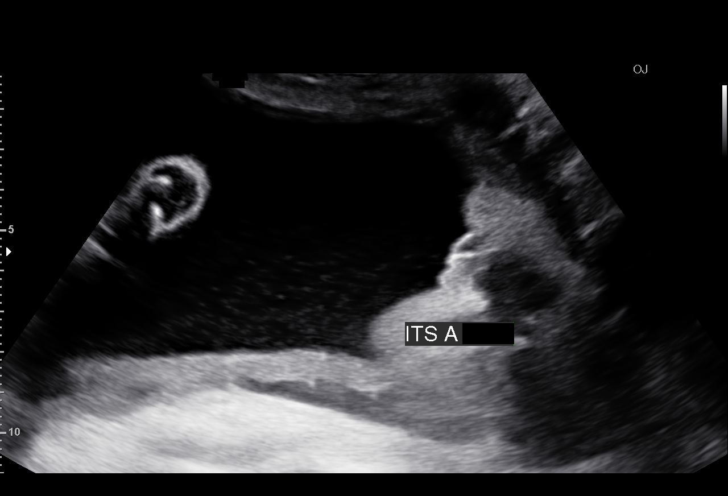
[im 22/64]
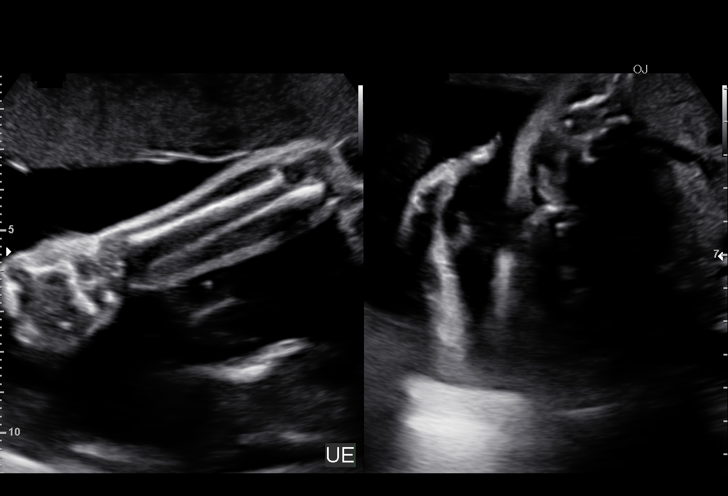
[im 26/64]
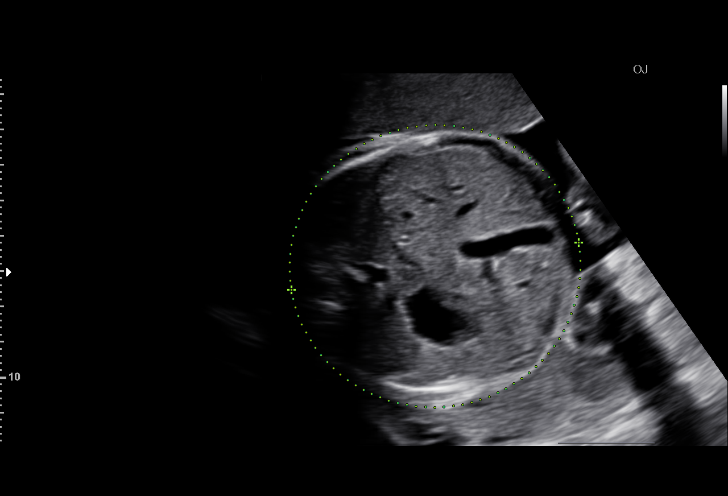
[im 31/64]
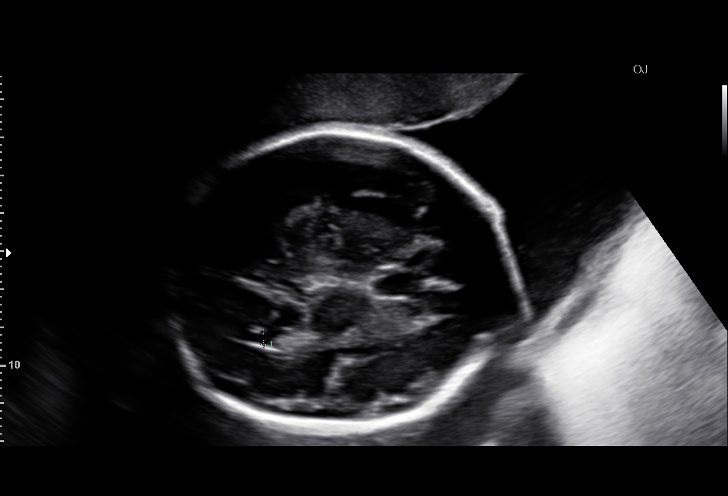
[im 36/64]
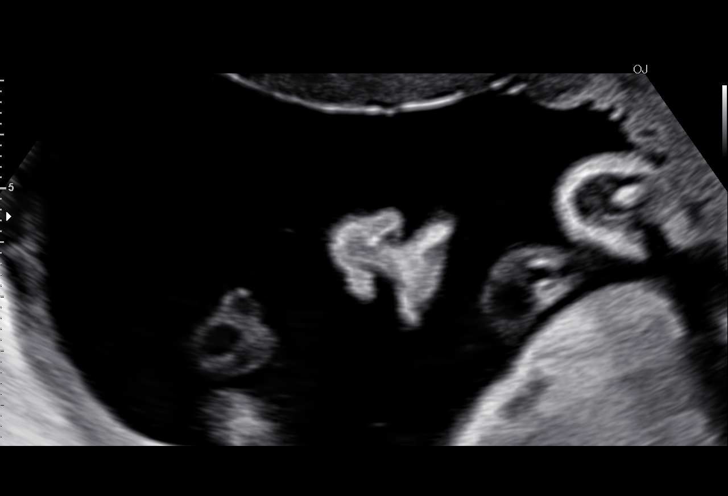
[im 40/64]
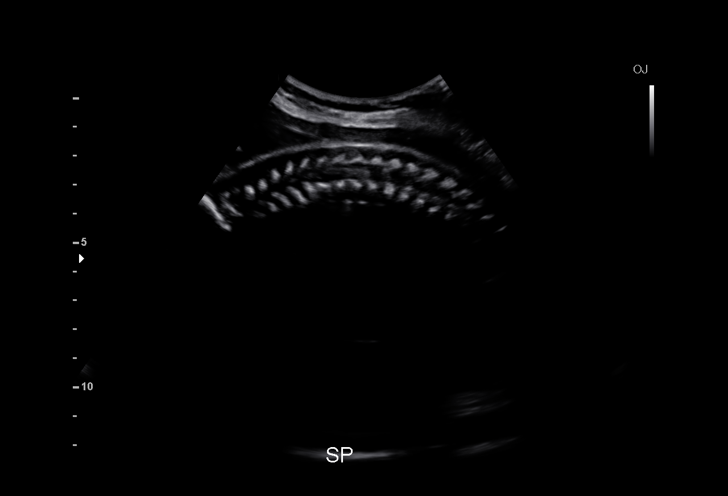
[im 45/64]
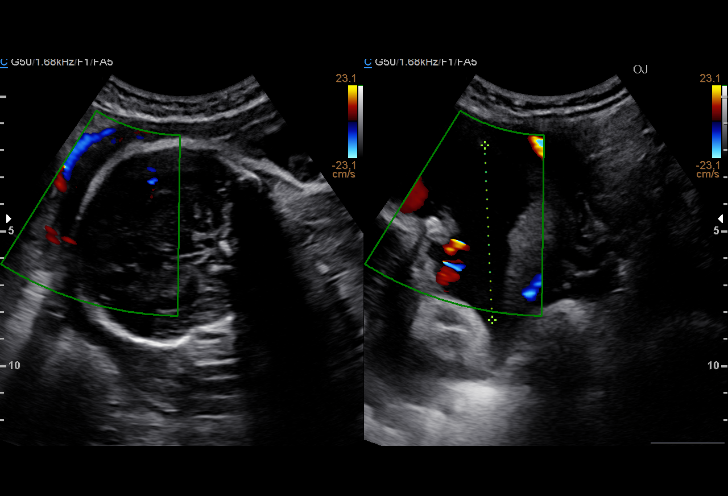
[im 50/64]
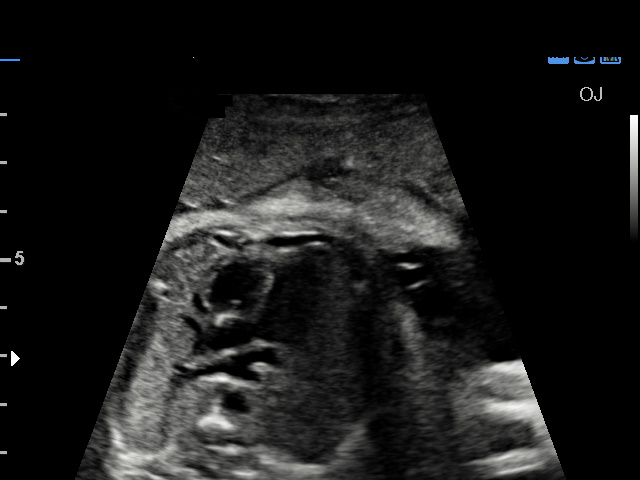
[im 54/64]
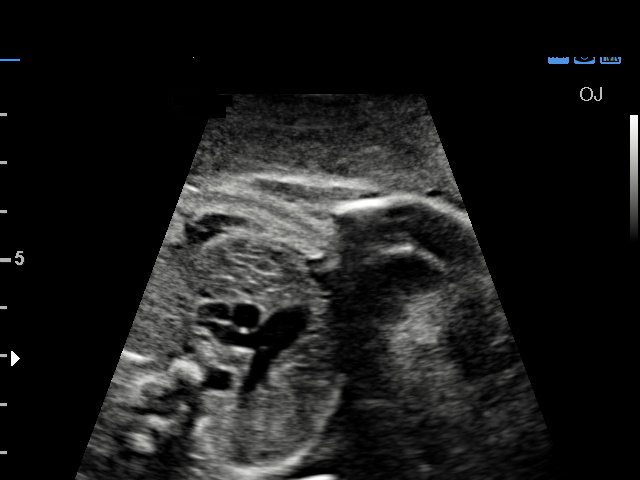
[im 59/64]
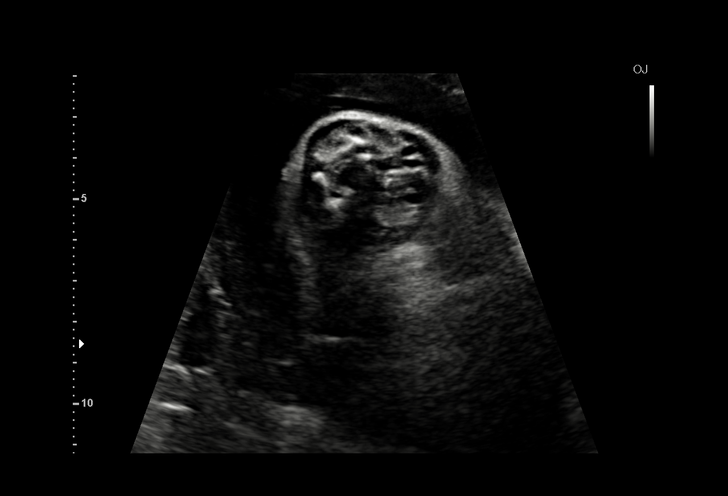
[im 64/64]
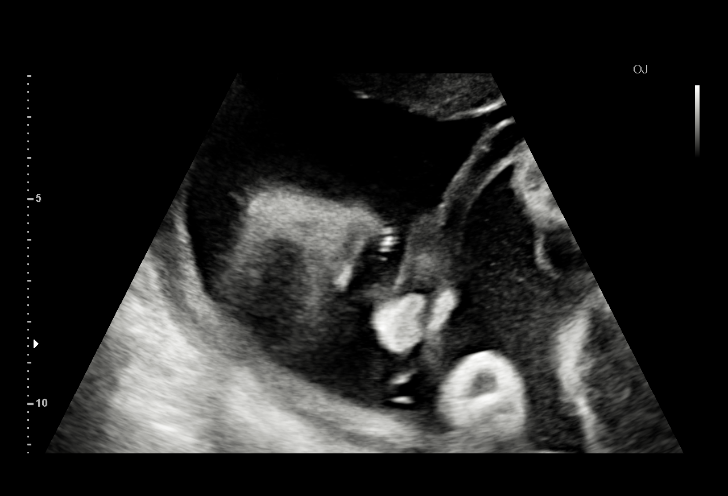

[14 of 28 positions shown; findings below may reference images not displayed]

OB/Gyn Clinic
[REDACTED]-
Faculty Physician

1  HERMES MOK           [id number]      [id number]     [id number]
Indications

29 weeks gestation of pregnancy
Late to prenatal care, third trimester
Medical complication of pregnancy (blood
coagulation disorder)
Basic anatomic survey                          Z36
OB History

Gravidity:    8         Term:   3        Prem:   0         SAB:   4
TOP:          0       Ectopic:  0        Living: 3
Fetal Evaluation

Num Of Fetuses:     1
Fetal Heart         154
Rate(bpm):
Cardiac Activity:   Observed
Presentation:       Breech
Placenta:           Anterior, above cervical os
P. Cord Insertion:  Visualized

Amniotic Fluid
AFI FV:      Subjectively within normal limits
AFI Sum:     18.76    cm      72  %Tile      Larg Pckt:   7.21  cm
RUQ:   6.47    cm   LUQ:    7.21   cm    LLQ:   5.08    cm
Biometry
BPD:      75.8  mm     G. Age:  30w 3d                  CI:          78.4  %    70 - 86
FL/HC:       20.9  %    19.2 -
HC:      270.8  mm     G. Age:  29w 4d         11  %    HC/AC:       1.05       0.99 -
AC:      257.1  mm     G. Age:  29w 6d         45  %    FL/BPD:      74.7  %    71 - 87
FL:       56.6  mm     G. Age:  29w 5d         32  %    FL/AC:       22.0  %    20 - 24
HUM:      49.9  mm     G. Age:  29w 2d         39  %
CER:      36.8  mm     G. Age:  31w 6d         73  %

Est. FW:    9096   gm     3 lb 4 oz     52  %
Gestational Age

U/S Today:     29w 6d                                        EDD:    09/06/15
Best:          29w 6d     Det. By:  U/S (06/27/15)           EDD:    09/06/15
Anatomy

Cranium:          Appears normal         Aortic Arch:      Appears normal
Fetal Cavum:      Appears normal         Ductal Arch:      Not well visualized
Ventricles:       Appears normal         Diaphragm:        Appears normal
Choroid Plexus:   Appears normal         Stomach:          Appears normal, left
sided
Cerebellum:       Appears normal         Abdomen:          Appears normal
Posterior Fossa:  Appears normal         Abdominal Wall:   Appears nml (cord
insert, abd wall)
Nuchal Fold:      Not applicable (>20    Cord Vessels:     Appears normal (3
wks GA)                                  vessel cord)
Face:             Orbits nl; profile not Kidneys:          Appear normal
well visualized
Lips:             Appears normal         Bladder:          Appears normal
Fetal Thoracic:   Appears normal         Spine:            Not well visualized
Heart:            Appears normal         Upper             Visualized
(4CH, axis, and situs  Extremities:
RVOT:             Appears normal         Lower             Visualized
Extremities:
LVOT:             Appears normal

Other:  Fetus appears to be a female. Technicallly difficult due to advanced
GA and fetal position
Impression

SIUP at 29+6 weeks
Normal detailed fetal anatomy; limited views of profile, DA
and spine
Normal amniotic fluid volume
EDC based on today's measurements
Recommendations

Follow-up as clinically indicated
Correlate today's EDC with prior OB records

## 2016-05-31 ENCOUNTER — Encounter (HOSPITAL_COMMUNITY): Payer: Self-pay | Admitting: Emergency Medicine

## 2016-05-31 DIAGNOSIS — Z5321 Procedure and treatment not carried out due to patient leaving prior to being seen by health care provider: Secondary | ICD-10-CM | POA: Insufficient documentation

## 2016-05-31 DIAGNOSIS — R531 Weakness: Secondary | ICD-10-CM | POA: Diagnosis present

## 2016-05-31 DIAGNOSIS — R42 Dizziness and giddiness: Secondary | ICD-10-CM | POA: Insufficient documentation

## 2016-05-31 LAB — CBC WITH DIFFERENTIAL/PLATELET
Basophils Absolute: 0.1 10*3/uL (ref 0.0–0.1)
Basophils Relative: 1 %
Eosinophils Absolute: 0.1 10*3/uL (ref 0.0–0.7)
Eosinophils Relative: 1 %
HEMATOCRIT: 42.2 % (ref 36.0–46.0)
HEMOGLOBIN: 14.3 g/dL (ref 12.0–15.0)
LYMPHS ABS: 2.8 10*3/uL (ref 0.7–4.0)
LYMPHS PCT: 35 %
MCH: 29.5 pg (ref 26.0–34.0)
MCHC: 33.9 g/dL (ref 30.0–36.0)
MCV: 87.2 fL (ref 78.0–100.0)
MONOS PCT: 5 %
Monocytes Absolute: 0.4 10*3/uL (ref 0.1–1.0)
NEUTROS ABS: 4.6 10*3/uL (ref 1.7–7.7)
NEUTROS PCT: 58 %
Platelets: 296 10*3/uL (ref 150–400)
RBC: 4.84 MIL/uL (ref 3.87–5.11)
RDW: 12.4 % (ref 11.5–15.5)
WBC: 8 10*3/uL (ref 4.0–10.5)

## 2016-05-31 LAB — CBG MONITORING, ED: Glucose-Capillary: 89 mg/dL (ref 65–99)

## 2016-05-31 LAB — I-STAT BETA HCG BLOOD, ED (MC, WL, AP ONLY): I-stat hCG, quantitative: <5 m[IU]/mL (ref <5)

## 2016-05-31 NOTE — ED Triage Notes (Signed)
Patient reports fatigue , generalized weakness , and dizziness for several weeks . Arabic interpreter service used at triage , denies fever or chills .

## 2016-05-31 NOTE — ED Notes (Signed)
Pt called in waiting. Pt over in peds with her daughter while daughter is getting triaged.

## 2016-06-01 ENCOUNTER — Emergency Department (HOSPITAL_COMMUNITY)
Admission: EM | Admit: 2016-06-01 | Discharge: 2016-06-01 | Disposition: A | Discharge status: Home / Self Care | Payer: Medicaid Other | Attending: Emergency Medicine | Admitting: Emergency Medicine

## 2016-06-01 LAB — COMPREHENSIVE METABOLIC PANEL
ALBUMIN: 4.5 g/dL (ref 3.5–5.0)
ALK PHOS: 65 U/L (ref 38–126)
ALT: 23 U/L (ref 14–54)
ANION GAP: 10 (ref 5–15)
AST: 18 U/L (ref 15–41)
BILIRUBIN TOTAL: 0.1 mg/dL — AB (ref 0.3–1.2)
BUN: 6 mg/dL (ref 6–20)
CALCIUM: 9.4 mg/dL (ref 8.9–10.3)
CO2: 20 mmol/L — ABNORMAL LOW (ref 22–32)
Chloride: 107 mmol/L (ref 101–111)
Creatinine, Ser: 0.44 mg/dL (ref 0.44–1.00)
GFR calc Af Amer: >60 mL/min (ref >60)
GFR calc non Af Amer: >60 mL/min (ref >60)
GLUCOSE: 88 mg/dL (ref 65–99)
Potassium: 3.7 mmol/L (ref 3.5–5.1)
Sodium: 137 mmol/L (ref 135–145)
TOTAL PROTEIN: 7.2 g/dL (ref 6.5–8.1)

## 2016-06-01 LAB — URINALYSIS, ROUTINE W REFLEX MICROSCOPIC
Bilirubin Urine: NEGATIVE
GLUCOSE, UA: NEGATIVE mg/dL
HGB URINE DIPSTICK: NEGATIVE
KETONES UR: NEGATIVE mg/dL
LEUKOCYTES UA: NEGATIVE
Nitrite: NEGATIVE
PROTEIN: NEGATIVE mg/dL
Specific Gravity, Urine: 1.027 (ref 1.005–1.030)
pH: 5 (ref 5.0–8.0)

## 2016-06-01 NOTE — ED Notes (Signed)
Pt requested breast pump, none available in PEDS, [facility] Pt remains in PEDS with her child at this time

## 2016-06-01 NOTE — ED Notes (Signed)
Pt will be seen by provider in PEDS

## 2016-06-01 NOTE — ED Notes (Signed)
Pt is in peds with her daughter

## 2016-06-01 NOTE — ED Notes (Signed)
Pt is out of room in radiology with her child

## 2016-06-01 NOTE — ED Notes (Signed)
Pt was called to be re-vitaled, no answer.

## 2016-06-01 NOTE — ED Notes (Signed)
Pt does not want to be seen, wants to go be with her child who is being admitted

## 2016-11-04 ENCOUNTER — Ambulatory Visit (INDEPENDENT_AMBULATORY_CARE_PROVIDER_SITE_OTHER): Payer: Medicaid Other | Admitting: Physician Assistant

## 2016-11-04 ENCOUNTER — Encounter (INDEPENDENT_AMBULATORY_CARE_PROVIDER_SITE_OTHER): Payer: Self-pay | Admitting: Physician Assistant

## 2016-11-04 VITALS — BP 93/69 | HR 74 | Temp 97.7°F | Ht 64.0 in | Wt 126.8 lb

## 2016-11-04 DIAGNOSIS — L659 Nonscarring hair loss, unspecified: Secondary | ICD-10-CM

## 2016-11-04 DIAGNOSIS — L509 Urticaria, unspecified: Secondary | ICD-10-CM

## 2016-11-04 MED ORDER — LORATADINE 10 MG PO TABS: 10.0000 mg | ORAL_TABLET | Freq: Every day | ORAL | 11 refills | Status: DC

## 2016-11-04 NOTE — Patient Instructions (Signed)
Hives Hives (urticaria) are itchy, red, swollen areas on your skin. Hives can appear on any part of your body and can vary in size. They can be as small as the tip of a pen or much larger. Hives often fade within 24 hours (acute hives). In other cases, new hives appear after old ones fade. This cycle can continue for several days or weeks (chronic hives). Hives result from your body's reaction to an irritant or to something that you are allergic to (trigger). When you are exposed to a trigger, your body releases a chemical (histamine) that causes redness, itching, and swelling. You can get hives immediately after being exposed to a trigger or hours later. Hives do not spread from person to person (are not contagious). Your hives may get worse with scratching, exercise, and emotional stress. What are the causes? Causes of this condition include:  Allergies to certain foods or ingredients.  Insect bites or stings.  Exposure to pollen or pet dander.  Contact with latex or chemicals.  Spending time in sunlight, heat, or cold (exposure).  Exercise.  Stress. You can also get hives from some medical conditions and treatments. These include:  Viruses, including the common cold.  Bacterial infections, such as urinary tract infections and strep throat.  Disorders such as vasculitis, lupus, or thyroid disease.  Certain medications.  Allergy shots.  Blood transfusions. Sometimes, the cause of hives is not known (idiopathic hives). What increases the risk? This condition is more likely to develop in:  Women.  People who have food allergies, especially to citrus fruits, milk, eggs, peanuts, tree nuts, or shellfish.  People who are allergic to:  Medicines.  Latex.  Insects.  Animals.  Pollen.  People who have certain medical conditions, includinglupus or thyroid disease. What are the signs or symptoms? The main symptom of this condition is raised, itchyred or white bumps or  patches on your skin. These areas may:  Become large and swollen (welts).  Change in shape and location, quickly and repeatedly.  Be separate hives or connect over a large area of skin.  Sting or become painful.  Turn white when pressed in the center (blanch). In severe cases, yourhands, feet, and face may also become swollen. This may occur if hives develop deeper in your skin. How is this diagnosed? This condition is diagnosed based on your symptoms, medical history, and physical exam. Your skin, urine, or blood may be tested to find out what is causing your hives and to rule out other health issues. Your health care provider may also remove a small sample of skin from the affected area and examine it under a microscope (biopsy). How is this treated? Treatment depends on the severity of your condition. Your health care provider may recommend using cool, wet cloths (cool compresses) or taking cool showers to relieve itching. Hives are sometimes treated with medicines, including:  Antihistamines.  Corticosteroids.  Antibiotics.  An injectable medicine (omalizumab). Your health care provider may prescribe this if you have chronic idiopathic hives and you continue to have symptoms even after treatment with antihistamines. Severe cases may require an emergency injection of adrenaline (epinephrine) to prevent a life-threatening allergic reaction (anaphylaxis). Follow these instructions at home: Medicines  Take or apply over-the-counter and prescription medicines only as told by your health care provider.  If you were prescribed an antibiotic medicine, use it as told by your health care provider. Do not stop taking the antibiotic even if you start to feel better. Skin Care    Apply cool compresses to the affected areas.  Do not scratch or rub your skin. General instructions  Do not take hot showers or baths. This can make itching worse.  Do not wear tight-fitting clothing.  Use  sunscreen and wear protective clothing when you are outside.  Avoid any substances that cause your hives. Keep a journal to help you track what causes your hives. Write down:  What medicines you take.  What you eat and drink.  What products you use on your skin.  Keep all follow-up visits as told by your health care provider. This is important. Contact a health care provider if:  Your symptoms are not controlled with medicine.  Your joints are painful or swollen. Get help right away if:  You have a fever.  You have pain in your abdomen.  Your tongue or lips are swollen.  Your eyelids are swollen.  Your chest or throat feels tight.  You have trouble breathing or swallowing. These symptoms may represent a serious problem that is an emergency. Do not wait to see if the symptoms will go away. Get medical help right away. Call your local emergency services (911 in the [place] Do not drive yourself to the hospital.  This information is not intended to replace advice given to you by your health care provider. Make sure you discuss any questions you have with your health care provider. Document Released: 06/07/2005 Document Revised: 11/05/2015 Document Reviewed: 03/26/2015 Elsevier Interactive Patient Education  2017 Elsevier Inc.  

## 2016-11-04 NOTE — Progress Notes (Signed)
Subjective:  Patient ID: Sonya Turner, female    DOB: 1986-04-23  Age: 31 y.o. MRN: [id number]  CC: concerns about skin  HPI Sonya Turner is a 31 y.o. female with a PMH of Factor V Leiden presents with rash and itching in different parts of her body. The rash waxes and wanes with raised welt like lesions at times. Onset of rash approximately one year ago. Moved here from [place] one year ago. Has not taken anything for relief. Also believes her hair is thinning from her scalp. No patches of baldness. Denies swelling of extremities, difficulty breathing, abdominal pain, CP, HA, f/c/n/v, or GI/GU symptoms.     Outpatient Medications Prior to Visit  Medication Sig Dispense Refill  . cyclobenzaprine (FLEXERIL) 10 MG tablet Take 1 tablet (10 mg total) by mouth every 8 (eight) hours as needed for muscle spasms. (Patient not taking: Reported on 11/04/2016) 30 tablet 1  . enoxaparin (LOVENOX) 40 MG/0.4ML injection Inject 0.4 mLs (40 mg total) into the skin daily. (Patient not taking: Reported on 11/04/2016) 30 Syringe 6  . Prenatal Multivit-Min-Fe-FA (PRENATAL VITAMINS) 0.8 MG tablet Take 1 tablet by mouth daily. (Patient not taking: Reported on 11/04/2016) 30 tablet 12   No facility-administered medications prior to visit.      ROS Review of Systems  Constitutional: Negative for chills, fever and malaise/fatigue.  Eyes: Negative for blurred vision.  Respiratory: Negative for shortness of breath.   Cardiovascular: Negative for chest pain and palpitations.  Gastrointestinal: Negative for abdominal pain and nausea.  Genitourinary: Negative for dysuria and hematuria.  Musculoskeletal: Negative for joint pain and myalgias.  Skin: Positive for rash.       Hair thinning.  Neurological: Negative for tingling and headaches.  Psychiatric/Behavioral: Negative for depression. The patient is not nervous/anxious.     Objective:  BP 93/69 (BP Location: Right Arm, Patient Position: Sitting, Cuff Size: Normal)    Pulse 74   Temp 97.7 F (36.5 C) (Oral)   Ht 5\' 4"  (1.626 m)   Wt 126 lb 12.8 oz (57.5 kg)   LMP 10/09/2016   SpO2 99%   Breastfeeding? Yes   BMI 21.77 kg/m   BP/Weight 11/04/2016 05/31/2016 03/30/2016  Systolic BP 93 118 105  Diastolic BP 69 74 72  Wt. (Lbs) 126.8 - -  BMI 21.77 - -      Physical Exam  Constitutional: She is oriented to person, place, and time.  Well developed, well nourished, NAD, polite  HENT:  Head: Normocephalic and atraumatic.  Eyes: No scleral icterus.  Cardiovascular: Normal rate, regular rhythm and normal heart sounds.   Pulmonary/Chest: Effort normal and breath sounds normal.  Abdominal: Soft. Bowel sounds are normal.  Musculoskeletal: She exhibits no edema.  Neurological: She is alert and oriented to person, place, and time.  Skin: Skin is warm and dry. Rash (left forearm with a lightly erythematous patch and with a few tiny overlying papules. No suppurration, bleeding, crusting, or scaling.) noted. No erythema. No pallor.  No alopecia areata of the affected area of scalp. No signs of fungal infection. Thinning not apparent but this is first time patient is seen here.  Psychiatric: She has a normal mood and affect. Her behavior is normal. Thought content normal.  Vitals reviewed.    Assessment & Plan:   1. Hives - Begin loratadine (CLARITIN) 10 MG tablet; Take 1 tablet (10 mg total) by mouth daily.  Dispense: 30 tablet; Refill: 11. * - CBC with Differential - Comprehensive metabolic panel  2. Hair loss - TSH   *Pt is breastfeeding.   Meds ordered this encounter  Medications  . loratadine (CLARITIN) 10 MG tablet    Sig: Take 1 tablet (10 mg total) by mouth daily.    Dispense:  30 tablet    Refill:  11    Order Specific Question:   Supervising Provider    Answer:   Quentin AngstJEGEDE, OLUGBEMIGA E [[id number]]    Follow-up: Return in about 4 weeks (around 12/02/2016) for full physical.   Loletta Specteroger David Gomez PA

## 2016-11-05 LAB — CBC WITH DIFFERENTIAL/PLATELET
BASOS ABS: 0.1 10*3/uL (ref 0.0–0.2)
BASOS: 1 %
EOS (ABSOLUTE): 0.1 10*3/uL (ref 0.0–0.4)
EOS: 2 %
HEMOGLOBIN: 13.6 g/dL (ref 11.1–15.9)
Hematocrit: 40.5 % (ref 34.0–46.6)
IMMATURE GRANS (ABS): 0 10*3/uL (ref 0.0–0.1)
Immature Granulocytes: 0 %
LYMPHS: 40 %
Lymphocytes Absolute: 2.7 10*3/uL (ref 0.7–3.1)
MCH: 29 pg (ref 26.6–33.0)
MCHC: 33.6 g/dL (ref 31.5–35.7)
MCV: 86 fL (ref 79–97)
MONOCYTES: 7 %
Monocytes Absolute: 0.5 10*3/uL (ref 0.1–0.9)
NEUTROS ABS: 3.4 10*3/uL (ref 1.4–7.0)
Neutrophils: 50 %
Platelets: 321 10*3/uL (ref 150–379)
RBC: 4.69 x10E6/uL (ref 3.77–5.28)
RDW: 13.3 % (ref 12.3–15.4)
WBC: 6.8 10*3/uL (ref 3.4–10.8)

## 2016-11-05 LAB — COMPREHENSIVE METABOLIC PANEL
A/G RATIO: 1.7 (ref 1.2–2.2)
ALT: 20 IU/L (ref 0–32)
AST: 14 IU/L (ref 0–40)
Albumin: 4.7 g/dL (ref 3.5–5.5)
Alkaline Phosphatase: 66 IU/L (ref 39–117)
BUN/Creatinine Ratio: 27 — ABNORMAL HIGH (ref 9–23)
BUN: 15 mg/dL (ref 6–20)
Bilirubin Total: 0.3 mg/dL (ref 0.0–1.2)
CALCIUM: 9.9 mg/dL (ref 8.7–10.2)
CO2: 20 mmol/L (ref 18–29)
CREATININE: 0.55 mg/dL — AB (ref 0.57–1.00)
Chloride: 102 mmol/L (ref 96–106)
GFR, EST AFRICAN AMERICAN: 146 mL/min/{1.73_m2} (ref >59)
GFR, EST NON AFRICAN AMERICAN: 126 mL/min/{1.73_m2} (ref >59)
Globulin, Total: 2.8 g/dL (ref 1.5–4.5)
Glucose: 89 mg/dL (ref 65–99)
POTASSIUM: 4.2 mmol/L (ref 3.5–5.2)
Sodium: 139 mmol/L (ref 134–144)
TOTAL PROTEIN: 7.5 g/dL (ref 6.0–8.5)

## 2016-11-05 LAB — TSH: TSH: 4.87 u[IU]/mL — ABNORMAL HIGH (ref 0.450–4.500)

## 2016-12-06 ENCOUNTER — Encounter (INDEPENDENT_AMBULATORY_CARE_PROVIDER_SITE_OTHER): Payer: Self-pay | Admitting: Physician Assistant

## 2016-12-06 ENCOUNTER — Ambulatory Visit (INDEPENDENT_AMBULATORY_CARE_PROVIDER_SITE_OTHER): Payer: Medicaid Other | Admitting: Physician Assistant

## 2016-12-06 VITALS — BP 112/73 | HR 70 | Temp 98.0°F | Wt 127.8 lb

## 2016-12-06 DIAGNOSIS — M5441 Lumbago with sciatica, right side: Secondary | ICD-10-CM | POA: Diagnosis not present

## 2016-12-06 DIAGNOSIS — Z0000 Encounter for general adult medical examination without abnormal findings: Secondary | ICD-10-CM | POA: Diagnosis not present

## 2016-12-06 DIAGNOSIS — R946 Abnormal results of thyroid function studies: Secondary | ICD-10-CM | POA: Diagnosis not present

## 2016-12-06 DIAGNOSIS — R7989 Other specified abnormal findings of blood chemistry: Secondary | ICD-10-CM

## 2016-12-06 DIAGNOSIS — G8929 Other chronic pain: Secondary | ICD-10-CM

## 2016-12-06 DIAGNOSIS — L659 Nonscarring hair loss, unspecified: Secondary | ICD-10-CM

## 2016-12-06 DIAGNOSIS — Z124 Encounter for screening for malignant neoplasm of cervix: Secondary | ICD-10-CM

## 2016-12-06 MED ORDER — NAPROXEN 500 MG PO TABS: 500.0000 mg | ORAL_TABLET | Freq: Two times a day (BID) | ORAL | 0 refills | Status: DC

## 2016-12-06 MED ORDER — ACETAMINOPHEN 500 MG PO TABS: 500.0000 mg | ORAL_TABLET | Freq: Three times a day (TID) | ORAL | 0 refills | Status: AC | PRN

## 2016-12-06 NOTE — Patient Instructions (Signed)
Telogen effluvium

## 2016-12-06 NOTE — Progress Notes (Signed)
Subjective:  Patient ID: Sonya Turner, female    DOB: [birth year]  Age: 31 y.o. MRN: [id number]  CC: annual exam  HPI Sonya Turner is a 31 y.o. female presents for an annual physical. She is concerned of hair loss, gave birth approximately 15 months ago. Patient is concerned about hair loss. Had a mild irregularity with TSH at 4.8. Says she believes the part in the middle of the scalp is thinning out. Says that clumps are coming out on a daily basis.     Also concerned of LBP. Pain on back extension since giving birth. Did not have an epidural. Pain radiates down the right leg. Pain is 6/10 and described as burning and sometimes as a pressure in the low back. Denies saddle paresthesia, urinary incontinence, fecal incontinence, weakness of the legs, or paralysis.    ROS Review of Systems  Constitutional: Negative for chills, fever and malaise/fatigue.  Eyes: Negative for blurred vision.  Respiratory: Negative for shortness of breath.   Cardiovascular: Negative for chest pain and palpitations.  Gastrointestinal: Negative for abdominal pain and nausea.  Genitourinary: Negative for dysuria and hematuria.  Musculoskeletal: Positive for back pain. Negative for joint pain and myalgias.  Skin: Negative for rash.       Hair loss.  Neurological: Negative for tingling and headaches.  Psychiatric/Behavioral: Negative for depression. The patient is not nervous/anxious.     Objective:  BP 112/73 (BP Location: Left Arm, Patient Position: Sitting, Cuff Size: Normal)   Pulse 70   Temp 98 F (36.7 C) (Oral)   Wt 127 lb 12.8 oz (58 kg)   LMP 11/10/2016 (Exact Date)   SpO2 98%   Breastfeeding? Yes   BMI 21.94 kg/m   BP/Weight 12/06/2016 11/04/2016 05/31/2016  Systolic BP 112 93 118  Diastolic BP 73 69 74  Wt. (Lbs) 127.8 126.8 -  BMI 21.94 21.77 -      Physical Exam  Constitutional: She is oriented to person, place, and time.  Well developed, well nourished, NAD, polite  HENT:  Head:  Normocephalic and atraumatic.  Right Ear: External ear normal.  Left Ear: External ear normal.  Eyes: No scleral icterus.  Neck: Normal range of motion. Neck supple. No thyromegaly present.  Cardiovascular: Normal rate, regular rhythm and normal heart sounds.   Pulmonary/Chest: Effort normal and breath sounds normal.  Abdominal: Soft. Bowel sounds are normal. She exhibits no distension. There is no tenderness.  Musculoskeletal: She exhibits no edema.  Mild TTP along L4-L5. No spasm of lumbar spine. Gait normal. Back with full aROM but pain elicited with extension.  Neurological: She is alert and oriented to person, place, and time. No cranial nerve deficit. Coordination normal.  Skin: Skin is warm and dry. No rash noted. No erythema. No pallor.  Mild thinning of hair on top of scalp. No patches of alopecia. Scalp not erythematous or flaky/scaly  Psychiatric: She has a normal mood and affect. Her behavior is normal. Thought content normal.  Vitals reviewed.    Assessment & Plan:   1. Annual physical exam - Lipid Panel  2. Hair loss - Thyroid Panel With TSH  3. Abnormal TSH - Thyroid Panel With TSH  4. Chronic midline low back pain with right-sided sciatica - DG Lumbar Spine Complete; Future - Begin acetaminophen (TYLENOL) 500 MG tablet; Take 1 tablet (500 mg total) by mouth every 8 (eight) hours as needed.  Dispense: 7 tablet; Refill: 0  5. Screening for cervical cancer - Ambulatory referral to Gynecology. Prefers  female GYN.   Meds ordered this encounter  Medications  . DISCONTD: naproxen (NAPROSYN) 500 MG tablet    Sig: Take 1 tablet (500 mg total) by mouth 2 (two) times daily with a meal.    Dispense:  30 tablet    Refill:  0    Order Specific Question:   Supervising Provider    Answer:   Quentin Angst [id number]  . acetaminophen (TYLENOL) 500 MG tablet    Sig: Take 1 tablet (500 mg total) by mouth every 8 (eight) hours as needed.    Dispense:  7 tablet     Refill:  0    Order Specific Question:   Supervising Provider    Answer:   Quentin Angst [id number]    Follow-up: Return in about 4 weeks (around 01/03/2017) for low back pain.   Loletta Specter PA

## 2016-12-07 ENCOUNTER — Other Ambulatory Visit (INDEPENDENT_AMBULATORY_CARE_PROVIDER_SITE_OTHER): Payer: Self-pay | Admitting: Physician Assistant

## 2016-12-07 DIAGNOSIS — E785 Hyperlipidemia, unspecified: Secondary | ICD-10-CM

## 2016-12-07 LAB — LIPID PANEL
CHOLESTEROL TOTAL: 206 mg/dL — AB (ref 100–199)
Chol/HDL Ratio: 4.2 ratio (ref 0.0–4.4)
HDL: 49 mg/dL (ref >39)
LDL Calculated: 128 mg/dL — ABNORMAL HIGH (ref 0–99)
Triglycerides: 147 mg/dL (ref 0–149)
VLDL CHOLESTEROL CAL: 29 mg/dL (ref 5–40)

## 2016-12-07 LAB — THYROID PANEL WITH TSH
FREE THYROXINE INDEX: 2.7 (ref 1.2–4.9)
T3 Uptake Ratio: 29 % (ref 24–39)
T4, Total: 9.2 ug/dL (ref 4.5–12.0)
TSH: 3.07 u[IU]/mL (ref 0.450–4.500)

## 2017-01-03 ENCOUNTER — Ambulatory Visit (INDEPENDENT_AMBULATORY_CARE_PROVIDER_SITE_OTHER): Payer: Medicaid Other | Admitting: Physician Assistant

## 2017-01-10 ENCOUNTER — Encounter: Payer: Medicaid Other | Admitting: Obstetrics and Gynecology

## 2017-01-27 ENCOUNTER — Telehealth (INDEPENDENT_AMBULATORY_CARE_PROVIDER_SITE_OTHER): Payer: Self-pay | Admitting: Physician Assistant

## 2017-01-27 ENCOUNTER — Other Ambulatory Visit (INDEPENDENT_AMBULATORY_CARE_PROVIDER_SITE_OTHER): Payer: Self-pay | Admitting: Physician Assistant

## 2017-01-27 DIAGNOSIS — L659 Nonscarring hair loss, unspecified: Secondary | ICD-10-CM

## 2017-01-27 NOTE — Telephone Encounter (Signed)
Her Thyroid is normal. Therefore she may be referred not to endocrinology but to dermatology.

## 2017-01-27 NOTE — Telephone Encounter (Signed)
FWD to PCP. Tempestt S Roberts, CMA  

## 2017-01-27 NOTE — Progress Notes (Signed)
Suspected telogen effluvium. Pt requests specialty referral.

## 2017-01-27 NOTE — Telephone Encounter (Signed)
Patient wants to be refer to an Endocrinologist specialist Thank you

## 2017-02-09 ENCOUNTER — Ambulatory Visit: Payer: Medicaid Other

## 2017-03-11 ENCOUNTER — Ambulatory Visit: Payer: Self-pay

## 2017-07-29 ENCOUNTER — Other Ambulatory Visit: Payer: Self-pay

## 2017-07-29 ENCOUNTER — Ambulatory Visit (INDEPENDENT_AMBULATORY_CARE_PROVIDER_SITE_OTHER): Payer: Self-pay | Admitting: Family Medicine

## 2017-07-29 ENCOUNTER — Encounter: Payer: Self-pay | Admitting: Family Medicine

## 2017-07-29 VITALS — BP 92/56 | HR 77 | Temp 98.0°F | Ht 64.0 in | Wt 124.2 lb

## 2017-07-29 DIAGNOSIS — R51 Headache: Secondary | ICD-10-CM

## 2017-07-29 DIAGNOSIS — G8929 Other chronic pain: Secondary | ICD-10-CM

## 2017-07-29 DIAGNOSIS — R519 Headache, unspecified: Secondary | ICD-10-CM | POA: Insufficient documentation

## 2017-07-29 DIAGNOSIS — M25562 Pain in left knee: Secondary | ICD-10-CM

## 2017-07-29 DIAGNOSIS — Z0289 Encounter for other administrative examinations: Secondary | ICD-10-CM

## 2017-07-29 DIAGNOSIS — M25569 Pain in unspecified knee: Secondary | ICD-10-CM | POA: Insufficient documentation

## 2017-07-29 MED ORDER — IBUPROFEN 600 MG PO TABS: 600.0000 mg | ORAL_TABLET | Freq: Four times a day (QID) | ORAL | 0 refills | Status: DC | PRN

## 2017-07-29 MED ORDER — BACLOFEN 10 MG PO TABS: 10.0000 mg | ORAL_TABLET | Freq: Every day | ORAL | 0 refills | Status: DC | PRN

## 2017-07-29 NOTE — Patient Instructions (Signed)
It was very good to meet you today.  We will get you set up for financial aid.  After that is done, come back and we can get bloodwork and your xrays.  In the meantime, take the Ibuprofen 600 mg when you have a headache.  You can take this every 6 hours as needed.  Take the Baclofen once a day as needed for back pain and spasms.

## 2017-07-29 NOTE — Assessment & Plan Note (Signed)
Seen at health dept.  New visit here with our clinic today.  Needs screening labs -- awaiting insurance status for payment.

## 2017-07-29 NOTE — Assessment & Plan Note (Signed)
Some description of both tension headaches and migraines.  With some muscular tension BL traps. Plan tx with ibuprofen to see if this helps.  Also baclofen as muscle relaxer. FU in 2-4 weeks

## 2017-07-29 NOTE — Progress Notes (Signed)
[employer] interpreter Husam [id number] interpreter utilized during today's visit.  New patient visit:  HPI:  [facility] to establish general primary care, also to discuss headaches and knee pain.  Knee pain:  Present for around 1 month.  No falls or injuries.  No redness or swelling that she's seen.  Describes pain medial aspect of joint line.  Worse when standing or trying to go up steps.  Not present at rest.    Headaches:  Describes several times a week pounding headache usually one sided but occasionally bilateral.  No nausea.  Does have blurry vision.  Also some lightheaded symptoms.  Doesn't drink much water or other fluids.  No weakness or numbness.  Present for years.  Not acutely worsening.  Has some OTC analgesics she doesn't know the name of at home which sometimes help.    ROS: No CP/dyspnea/nausea or vomiting  Past Medical Hx:  -no prior hospitalizations - Factor V Leiden  Past Surgical Hx:  -Prior appendectomy  Family Hx: updated in Epic Lives here in [place] with husband and 4 children.  States that everyone is healthy and doing well.  - Denies any heart disease, asthma, or other medical conditions in family  Immigrant Social History: - Date arrived in [place]: 2016 - Country of origin: [place] - Location of refugee camp (if applicable), how long there, and what caused patient to leave home country?: [place] -- was in a refugee camp for 4 years there. - Primary language: Arabic  -Requires intepreter (essentially speaks no [place]) - Education: Highest level of education: 10th grade - Prior work: n/a - no tobacco, EtOH, or drug use  Preventative Care History: -needs pap smear.  Desires female physician.   PHYSICAL EXAM: BP (!) 92/56   Pulse 77   Temp 98 F (36.7 C) (Oral)   Ht 5\' 4"  (1.626 m)   Wt 124 lb 3.2 oz (56.3 kg)   SpO2 99%   BMI 21.32 kg/m  Gen:  Alert, cooperative patient who appears stated age in no acute distress.   Vital signs reviewed. Head: /AT.   Eyes:  EOMI, PERRL.   Ears:  External ears WNL, Bilateral TM's normal without retraction, redness or bulging. Nose:  Septum midline  Mouth:  MMM, tonsils non-erythematous, non-edematous.   Neck:  No LAD.  TTP BL trapezius muscles.   Cardiac:  Regular rate and rhythm without murmur auscultated.  Good S1/S2. Pulm:  Clear to auscultation bilaterally with good air movement.  No wheezes or rales noted.   Ext:  No LE edema MSK:  TTP along medial joint line of Left knee.  No ligamentous laxity.  Ant/Post drawer negative.   Neuro:  Strength and sensation fully intact BL LE's  Entire visit took 45 minutes face time due to language barrier and being a new patient.

## 2017-07-29 NOTE — Assessment & Plan Note (Signed)
Likely strain of the knee.  She has no insurance except family planning medicaid.  Attempting to help with Medicaid or financial assistance.  Once obtained, will get xrays of knee.   Ibuprofen for pain relief in meantime.

## 2017-08-03 ENCOUNTER — Ambulatory Visit: Payer: Self-pay

## 2017-11-07 NOTE — Addendum Note (Signed)
Addended byGwendolyn Grant, Newt Lukes on: 11/07/2017 03:14 PM   Modules accepted: Level of Service

## 2018-05-15 ENCOUNTER — Encounter (HOSPITAL_COMMUNITY): Payer: Self-pay | Admitting: *Deleted

## 2018-05-15 ENCOUNTER — Inpatient Hospital Stay (HOSPITAL_COMMUNITY)
Admission: AD | Admit: 2018-05-15 | Discharge: 2018-05-15 | Disposition: A | Discharge status: Home / Self Care | Payer: Medicaid Other | Source: Ambulatory Visit | Attending: Obstetrics and Gynecology | Admitting: Obstetrics and Gynecology

## 2018-05-15 ENCOUNTER — Inpatient Hospital Stay (HOSPITAL_COMMUNITY): Payer: Medicaid Other

## 2018-05-15 ENCOUNTER — Other Ambulatory Visit: Payer: Self-pay

## 2018-05-15 DIAGNOSIS — O3481 Maternal care for other abnormalities of pelvic organs, first trimester: Secondary | ICD-10-CM | POA: Diagnosis not present

## 2018-05-15 DIAGNOSIS — O26891 Other specified pregnancy related conditions, first trimester: Secondary | ICD-10-CM | POA: Diagnosis not present

## 2018-05-15 DIAGNOSIS — M549 Dorsalgia, unspecified: Secondary | ICD-10-CM | POA: Insufficient documentation

## 2018-05-15 DIAGNOSIS — Z79899 Other long term (current) drug therapy: Secondary | ICD-10-CM | POA: Insufficient documentation

## 2018-05-15 DIAGNOSIS — Z3201 Encounter for pregnancy test, result positive: Secondary | ICD-10-CM | POA: Insufficient documentation

## 2018-05-15 DIAGNOSIS — O3680X0 Pregnancy with inconclusive fetal viability, not applicable or unspecified: Secondary | ICD-10-CM | POA: Insufficient documentation

## 2018-05-15 DIAGNOSIS — Z3A01 Less than 8 weeks gestation of pregnancy: Secondary | ICD-10-CM | POA: Diagnosis not present

## 2018-05-15 DIAGNOSIS — Z791 Long term (current) use of non-steroidal anti-inflammatories (NSAID): Secondary | ICD-10-CM | POA: Diagnosis not present

## 2018-05-15 DIAGNOSIS — Z3A00 Weeks of gestation of pregnancy not specified: Secondary | ICD-10-CM | POA: Diagnosis not present

## 2018-05-15 DIAGNOSIS — D6851 Activated protein C resistance: Secondary | ICD-10-CM | POA: Diagnosis not present

## 2018-05-15 DIAGNOSIS — O09291 Supervision of pregnancy with other poor reproductive or obstetric history, first trimester: Secondary | ICD-10-CM | POA: Diagnosis not present

## 2018-05-15 DIAGNOSIS — O99111 Other diseases of the blood and blood-forming organs and certain disorders involving the immune mechanism complicating pregnancy, first trimester: Secondary | ICD-10-CM | POA: Diagnosis not present

## 2018-05-15 HISTORY — DX: Fracture of unspecified carpal bone, unspecified wrist, initial encounter for closed fracture: S62.109A

## 2018-05-15 HISTORY — DX: Headache, unspecified: R51.9

## 2018-05-15 HISTORY — DX: Headache: R51

## 2018-05-15 HISTORY — DX: Unspecified fracture of left foot, initial encounter for closed fracture: S92.902A

## 2018-05-15 LAB — COMPREHENSIVE METABOLIC PANEL
ALK PHOS: 43 U/L (ref 38–126)
ALT: 28 U/L (ref 0–44)
AST: 16 U/L (ref 15–41)
Albumin: 4.4 g/dL (ref 3.5–5.0)
Anion gap: 7 (ref 5–15)
BILIRUBIN TOTAL: 0.7 mg/dL (ref 0.3–1.2)
BUN: 9 mg/dL (ref 6–20)
CALCIUM: 9 mg/dL (ref 8.9–10.3)
CO2: 25 mmol/L (ref 22–32)
Chloride: 105 mmol/L (ref 98–111)
Creatinine, Ser: 0.56 mg/dL (ref 0.44–1.00)
Glucose, Bld: 114 mg/dL — ABNORMAL HIGH (ref 70–99)
Potassium: 4.4 mmol/L (ref 3.5–5.1)
Sodium: 137 mmol/L (ref 135–145)
Total Protein: 7.6 g/dL (ref 6.5–8.1)

## 2018-05-15 LAB — CBC WITH DIFFERENTIAL/PLATELET
BASOS ABS: 0.1 10*3/uL (ref 0.0–0.1)
Basophils Relative: 1 %
EOS PCT: 1 %
Eosinophils Absolute: 0.1 10*3/uL (ref 0.0–0.5)
HEMATOCRIT: 41.1 % (ref 36.0–46.0)
HEMOGLOBIN: 13.6 g/dL (ref 12.0–15.0)
LYMPHS ABS: 2.6 10*3/uL (ref 0.7–4.0)
LYMPHS PCT: 40 %
MCH: 29.2 pg (ref 26.0–34.0)
MCHC: 33.1 g/dL (ref 30.0–36.0)
MCV: 88.2 fL (ref 80.0–100.0)
Monocytes Absolute: 0.2 10*3/uL (ref 0.1–1.0)
Monocytes Relative: 4 %
NEUTROS ABS: 3.6 10*3/uL (ref 1.7–7.7)
NRBC: 0 % (ref 0.0–0.2)
Neutrophils Relative %: 54 %
PLATELETS: 289 10*3/uL (ref 150–400)
RBC: 4.66 MIL/uL (ref 3.87–5.11)
RDW: 12.5 % (ref 11.5–15.5)
WBC: 6.6 10*3/uL (ref 4.0–10.5)

## 2018-05-15 LAB — HCG, QUANTITATIVE, PREGNANCY: HCG, BETA CHAIN, QUANT, S: 100 m[IU]/mL — AB (ref <5)

## 2018-05-15 LAB — POCT PREGNANCY, URINE: Preg Test, Ur: POSITIVE — AB

## 2018-05-15 MED ORDER — LACTATED RINGERS IV SOLN: Freq: Once | INTRAVENOUS | Status: DC

## 2018-05-15 MED ORDER — NIFEDIPINE 10 MG PO CAPS: 10.0000 mg | ORAL_CAPSULE | Freq: Once | ORAL | Status: DC

## 2018-05-15 NOTE — Discharge Instructions (Signed)
Human Chorionic Gonadotropin Test °Human chorionic gonadotropin (hCG) is a hormone produced during pregnancy by the cells that form the placenta. The placenta is the organ that grows inside your womb (uterus) to nourish a developing baby. When you are pregnant, hCG starts to appear in your blood about 11 days after conception. It continues to go up for the first 8-11 weeks of pregnancy. °Your hCG level can be measured with several different types of tests. You may have: °· A urine test. °? hCG is eliminated from your body by your kidneys, so a urine test is one way to check for this hormone. °? A urine test only shows whether there is hCG in your urine. It does not measure how much. °? You may have a urine test to find out whether you are pregnant. °? A home pregnancy test detects whether there is hCG in your urine. °· A qualitative blood test. °? Like the urine test, this blood test only shows whether there is hCG in your blood. It does not measure how much. °? You may have this type of blood test to find out whether you are pregnant. °· A quantitative blood test. °? This type of blood test measures the amount of hCG in your blood. °? You may have this type of test to diagnose an abnormal pregnancy or determine whether you are at risk of, or have had, a failed pregnancy (miscarriage). ° °How do I prepare for this test? °For the urine test: °· Limit your fluid intake before the urine test as directed by your health care provider. °· Collect the sample the first time you urinate in the morning. °· Let your health care provider know if you have blood in your urine. This may interfere with the test result. ° °Some medicines may interfere with the urine and blood tests. Let your health care provider know about all the medicines you are taking. No additional preparation is required for the blood test. °What do the results mean? °It is your responsibility to obtain your test results. Ask the lab or department performing  the test when and how you will get your results. Talk to your health care provider if you have any questions about your test results. °The results of the hCG urine test and the qualitative hCG blood test are either positive or negative. The results of the quantitative hCG blood test are reported as a number. hCG is measured in international units per liter (IU/L). °Meaning of Negative Test Results °A negative result on a urine or qualitative blood test could mean that you are not pregnant. It could also mean the test was done too early to detect hCG. If you still have other signs of pregnancy, the test should be repeated. °Meaning of Positive Test Results °A positive result on the urine or qualitative blood tests means you are most likely pregnant. Your health care provider may confirm your pregnancy with an imaging study of the inside of your uterus at 5-6 weeks (ultrasound). °Range of Normal Values °Ranges for normal values for the quantitative hCG blood test may vary among different labs and hospitals. You should always check with your health care provider after having lab work or other tests done to discuss whether your values are considered within normal limits. °· Less than 5 IU/L means it is most likely you are not pregnant. °· Greater than 25 IU/L means it is most likely you are pregnant. ° °Meaning of Results Outside Normal Value Ranges °If your hCG   level on the quantitative test is not what would be expected, you may have the test again. It may also be important for your health care provider to know whether your hCG level goes up or down over time. Common causes of results outside the normal range include: °· Being pregnant with twins (hCG level is higher than expected). °· Having an ectopic pregnancy (hCG rises more slowly than expected). °· Miscarriage (hCG level falls). °· Abnormal growths in the womb (hCG level is higher than expected). ° °Talk with your health care provider to discuss your results,  treatment options, and if necessary, the need for more tests. Talk with your health care provider if you have any questions about your results. °This information is not intended to replace advice given to you by your health care provider. Make sure you discuss any questions you have with your health care provider. °Document Released: 07/09/2004 Document Revised: 02/11/2016 Document Reviewed: 09/11/2013 °Elsevier Interactive Patient Education © 2018 Elsevier Inc. ° °

## 2018-05-15 NOTE — MAU Note (Signed)
Pt states she now wants to stay - Arabic interpreter [id number] used.

## 2018-05-15 NOTE — Progress Notes (Signed)
Urine in lab 

## 2018-05-15 NOTE — MAU Provider Note (Signed)
History     CSN: [id number]  Arrival date and time: 05/15/18 1626   First Provider Initiated Contact with Patient 05/15/18 1817      Chief Complaint  Patient presents with  . Possible Pregnancy  . Back Pain   HPI  Elva Siebers is a 32 y.o. [id number] at [redacted]w[redacted]d who presents to MAU requesting pregnancy confirmation letter and Lovenox injection in the setting of hetergeneous Factor V Leiden. She signed in with complaint of back pain but verbalizes to CNM she always has back pain in first trimester. She denies vaginal bleeding, abnormal vaginal discharge, SOB, chest pain, fever or recent illness.  OB History    Gravida  9   Para  4   Term  4   Preterm      AB  4   Living  4     SAB  4   TAB      Ectopic      Multiple  0   Live Births  4           Past Medical History:  Diagnosis Date  . Factor V Leiden (HCC)   . Foot fracture, left   . Headache   . Wrist fracture    left    Past Surgical History:  Procedure Laterality Date  . APPENDECTOMY      Family History  Problem Relation Age of Onset  . Diabetes Mother   . Hypertension Mother   . Healthy Father   . Hearing loss Neg Hx     Social History   Tobacco Use  . Smoking status: Never Smoker  . Smokeless tobacco: Never Used  Substance Use Topics  . Alcohol use: No  . Drug use: No    Allergies: No Known Allergies  Medications Prior to Admission  Medication Sig Dispense Refill Last Dose  . baclofen (LIORESAL) 10 MG tablet Take 1 tablet (10 mg total) by mouth daily as needed for muscle spasms. 30 each 0   . ibuprofen (ADVIL,MOTRIN) 600 MG tablet Take 1 tablet (600 mg total) by mouth every 6 (six) hours as needed. 30 tablet 0   . loratadine (CLARITIN) 10 MG tablet Take 1 tablet (10 mg total) by mouth daily. 30 tablet 11     Review of Systems  Constitutional: Negative for chills, fatigue and fever.  Respiratory: Negative for shortness of breath.   Gastrointestinal: Negative for abdominal pain.   Genitourinary: Negative for vaginal bleeding, vaginal discharge and vaginal pain.  Musculoskeletal: Positive for back pain.  Neurological: Negative for headaches.  All other systems reviewed and are negative.  Physical Exam   Blood pressure 122/85, pulse (!) 103, temperature 98.2 F (36.8 C), temperature source Oral, resp. rate 16, weight 56.8 kg, last menstrual period 04/15/2018, SpO2 100 %, currently breastfeeding.  Physical Exam  Nursing note and vitals reviewed. Constitutional: She is oriented to person, place, and time. She appears well-developed and well-nourished.  Cardiovascular: Normal rate.  Respiratory: Effort normal.  GI: Soft.  Neurological: She is alert and oriented to person, place, and time.  Skin: Skin is warm and dry.  Psychiatric: She has a normal mood and affect. Her behavior is normal. Judgment and thought content normal.    MAU Course/MDM  Procedures  --Patient spent initial 2 hours 26 minutes in MAU declining first trimester workup. After that time she spent approximately 40 minutes planning to leave AMA then felt uncomfortable signing AMA paperwork  --Patient very concerned about prophylactice DVT administration. Chief complaints  and history discussed with Dr. Alysia Penna. Lovenox not indicated   Patient Vitals for the past 24 hrs:  BP Temp Temp src Pulse Resp SpO2 Weight  05/15/18 2029 109/67 - - - - - -  05/15/18 1641 122/85 98.2 F (36.8 C) Oral (!) 103 16 100 % 56.8 kg    Results for orders placed or performed during the hospital encounter of 05/15/18 (from the past 24 hour(s))  Pregnancy, urine POC     Status: Abnormal   Collection Time: 05/15/18  5:10 PM  Result Value Ref Range   Preg Test, Ur POSITIVE (A) NEGATIVE  CBC with Differential/Platelet     Status: None   Collection Time: 05/15/18  7:14 PM  Result Value Ref Range   WBC 6.6 4.0 - 10.5 K/uL   RBC 4.66 3.87 - 5.11 MIL/uL   Hemoglobin 13.6 12.0 - 15.0 g/dL   HCT 65.7 84.6 - 96.2 %   MCV  88.2 80.0 - 100.0 fL   MCH 29.2 26.0 - 34.0 pg   MCHC 33.1 30.0 - 36.0 g/dL   RDW 95.2 84.1 - 32.4 %   Platelets 289 150 - 400 K/uL   nRBC 0.0 0.0 - 0.2 %   Neutrophils Relative % 54 %   Neutro Abs 3.6 1.7 - 7.7 K/uL   Lymphocytes Relative 40 %   Lymphs Abs 2.6 0.7 - 4.0 K/uL   Monocytes Relative 4 %   Monocytes Absolute 0.2 0.1 - 1.0 K/uL   Eosinophils Relative 1 %   Eosinophils Absolute 0.1 0.0 - 0.5 K/uL   Basophils Relative 1 %   Basophils Absolute 0.1 0.0 - 0.1 K/uL  Comprehensive metabolic panel     Status: Abnormal   Collection Time: 05/15/18  7:14 PM  Result Value Ref Range   Sodium 137 135 - 145 mmol/L   Potassium 4.4 3.5 - 5.1 mmol/L   Chloride 105 98 - 111 mmol/L   CO2 25 22 - 32 mmol/L   Glucose, Bld 114 (H) 70 - 99 mg/dL   BUN 9 6 - 20 mg/dL   Creatinine, Ser 4.01 0.44 - 1.00 mg/dL   Calcium 9.0 8.9 - 02.7 mg/dL   Total Protein 7.6 6.5 - 8.1 g/dL   Albumin 4.4 3.5 - 5.0 g/dL   AST 16 15 - 41 U/L   ALT 28 0 - 44 U/L   Alkaline Phosphatase 43 38 - 126 U/L   Total Bilirubin 0.7 0.3 - 1.2 mg/dL   GFR calc non Af Amer >60 >60 mL/min   GFR calc Af Amer >60 >60 mL/min   Anion gap 7 5 - 15  hCG, quantitative, pregnancy     Status: Abnormal   Collection Time: 05/15/18  7:14 PM  Result Value Ref Range   hCG, Beta Chain, Quant, S 100 (H) <5 mIU/mL    US Ob Comp Less 14 Wks  Result Date: 05/15/2018 CLINICAL DATA:  Initial evaluation for acute back pain, early pregnancy. EXAM: OBSTETRIC <14 WK ULTRASOUND TECHNIQUE: Transabdominal ultrasound was performed for evaluation of the gestation as well as the maternal uterus and adnexal regions. COMPARISON:  None. FINDINGS: Intrauterine gestational sac: Not visualized. Endometrial complex measures 11.6 mm in thickness. Yolk sac:  Negative. Embryo:  Negative. Cardiac Activity: N/A Heart Rate: N/A bpm Subchorionic hemorrhage:  None visualized. Maternal uterus/adnexae: Left ovary normal in appearance. 2.1 x 1.8 x 2.3 cm right ovarian  corpus luteal cyst. Trace free fluid within the pelvis. IMPRESSION: 1. Early pregnancy with no  discrete IUP or adnexal mass identified. Finding is consistent with a pregnancy of unknown anatomic location. Differential considerations include IUP to early to visualize, recent SAB, or possibly occult ectopic pregnancy. Close clinical monitoring with serial beta HCGs and close interval follow-up ultrasound recommended as clinically warranted. 2. 2.3 cm right ovarian corpus luteal cyst. 3. No other acute maternal uterine or adnexal abnormality identified. Electronically Signed   By: Rise MuBenjamin  McClintock M.D.   On: 05/15/2018 20:29     Assessment and Plan  --32 y.o. [id number] with positive pregnancy test, pregnancy of unknown location or viability --Greater than 100 minutes spent at bedside discussing history, reasoning behind first trimester workup, plan of care based on lab work and ultrasound results --Discharge home in stable condition  F/U: Patient to return to MAU for repeat Quant hCG Thursday morning.  iPad Arabic interpreter for all patient interaction.   Calvert CantorSamantha C Weinhold, CNM 05/15/2018, 8:54 PM

## 2018-05-15 NOTE — MAU Note (Signed)
Wanting a preg test. +HPT. Hx of 4 miscarriages.  If preg needs injection?  Having a pain in her back for 2 wks, burning.  Has it when she is tired, or when she stands up.

## 2018-05-16 NOTE — Progress Notes (Signed)
  Subjective:   Patient ID: Sonya Turner    DOB: 16-May-1986, 32 y.o. female   MRN: [id number]  Sonya Turner is a 32 y.o. female with a history of Factor V Leiden, chronic headaches here for   Early Pregnancy Patient is G9 P4044 at 119w4d who presents to clinic for initiation of prenatal care.  She went to the MAU 05/15/2018 for pregnancy confirmation and Lovenox injection in the setting of Factor V Leiden.  Upreg and serum Hcg positive for pregnancy. At that time obtained the first trimester labs and transvaginal ultrasound which did not show IUP or adnexal mass, unclear if too early in pregnancy or possible ectopic.  Was instructed to return to the MAU 11/28 for serial quant hCG.  Was told at that time Lovenox was not indicated.  States with her prior pregnancies in [place] she always received anticoagulation with heparin and aspirin and is concerned why she is not getting any injections for this pregnancy.  She is not currently taking any prenatal vitamins.  She wishes to establish in our clinic for prenatal care.  She denies any vaginal bleeding or leakage of fluid.  She has had some back pain for about a week but denies any contractions.  Review of Systems:  Per HPI.  PMFSH, medications and smoking status reviewed.  Objective:   BP 92/60   Pulse 96   Temp 98.3 F (36.8 C) (Oral)   Ht 5\' 4"  (1.626 m)   Wt 124 lb 12.8 oz (56.6 kg)   LMP 04/15/2018   SpO2 98%   BMI 21.42 kg/m  Vitals and nursing note reviewed.  General: well nourished, well developed, in no acute distress with non-toxic appearance Lungs: normal work of breathing Extremities: warm and well perfused, normal tone MSK: ROM grossly intact, strength intact, gait normal Neuro: Alert and oriented, speech normal  Assessment & Plan:   Heterozygous factor V Leiden affecting pregnancy, antepartum (HCC) Explained to patient given her previous multiple miscarriages in light of her Factor V Leiden, she will have to be referred  to the high risk OB clinic for prenatal care, will place urgent referral.  Advised patient to return to MAU tomorrow morning for serial quant hCG as previously directed.  Gave prescription for prenatal vitamin.  Return precautions reviewed.  Orders Placed This Encounter  Procedures  . Ambulatory referral to Obstetrics / Gynecology    Referral Priority:   Urgent    Referral Type:   Consultation    Referral Reason:   Specialty Services Required    Requested Specialty:   Obstetrics and Gynecology    Number of Visits Requested:   1   Meds ordered this encounter  Medications  . Prenatal Vit-Fe Fumarate-FA (PRENATAL VITAMIN) 27-0.8 MG TABS    Sig: Take 1 tablet by mouth daily.    Dispense:  90 tablet    Refill:  2    Ellwood DenseAlison Trew Sunde, DO PGY-2, [facility] 05/17/2018 4:50 PM

## 2018-05-17 ENCOUNTER — Ambulatory Visit (INDEPENDENT_AMBULATORY_CARE_PROVIDER_SITE_OTHER): Payer: Medicaid Other | Admitting: Family Medicine

## 2018-05-17 ENCOUNTER — Other Ambulatory Visit: Payer: Self-pay

## 2018-05-17 VITALS — BP 92/60 | HR 96 | Temp 98.3°F | Ht 64.0 in | Wt 124.8 lb

## 2018-05-17 DIAGNOSIS — O2622 Pregnancy care for patient with recurrent pregnancy loss, second trimester: Secondary | ICD-10-CM

## 2018-05-17 DIAGNOSIS — D6851 Activated protein C resistance: Secondary | ICD-10-CM | POA: Diagnosis not present

## 2018-05-17 DIAGNOSIS — O99119 Other diseases of the blood and blood-forming organs and certain disorders involving the immune mechanism complicating pregnancy, unspecified trimester: Secondary | ICD-10-CM

## 2018-05-17 MED ORDER — PRENATAL VITAMIN 27-0.8 MG PO TABS: 1.0000 | ORAL_TABLET | Freq: Every day | ORAL | 2 refills | Status: DC

## 2018-05-17 NOTE — Assessment & Plan Note (Deleted)
Explained to patient given her previous multiple miscarriages in light of her Factor V Leiden, she will have to be referred to the high risk OB clinic for prenatal care, will place urgent referral.  Advised patient to return to MAU tomorrow morning for serial quant hCG as previously directed.  Gave prescription for prenatal vitamin.  Return precautions reviewed.

## 2018-05-17 NOTE — Assessment & Plan Note (Signed)
Explained to patient given her previous multiple miscarriages in light of her Factor V Leiden, she will have to be referred to the high risk OB clinic for prenatal care, will place urgent referral.  Advised patient to return to MAU tomorrow morning for serial quant hCG as previously directed.  Gave prescription for prenatal vitamin.  Return precautions reviewed. 

## 2018-05-17 NOTE — Patient Instructions (Addendum)
It was great to see you!  Our plans for today:  - I placed an urgent referral to OB/GYN for high risk prenatal care. - [facility] ([facility] for repeat blood work. - You should take prenatal vitamins.  Take care and seek immediate care sooner if you develop any concerns.   Dr. Mollie Germanyumball [facility]

## 2018-05-18 ENCOUNTER — Inpatient Hospital Stay (HOSPITAL_COMMUNITY)
Admission: AD | Admit: 2018-05-18 | Discharge: 2018-05-18 | Disposition: A | Discharge status: Home / Self Care | Payer: Medicaid Other | Source: Ambulatory Visit | Attending: Obstetrics and Gynecology | Admitting: Obstetrics and Gynecology

## 2018-05-18 DIAGNOSIS — Z79899 Other long term (current) drug therapy: Secondary | ICD-10-CM | POA: Insufficient documentation

## 2018-05-18 DIAGNOSIS — Z3A01 Less than 8 weeks gestation of pregnancy: Secondary | ICD-10-CM

## 2018-05-18 DIAGNOSIS — Z09 Encounter for follow-up examination after completed treatment for conditions other than malignant neoplasm: Secondary | ICD-10-CM | POA: Insufficient documentation

## 2018-05-18 DIAGNOSIS — D6851 Activated protein C resistance: Secondary | ICD-10-CM | POA: Diagnosis not present

## 2018-05-18 DIAGNOSIS — Z7982 Long term (current) use of aspirin: Secondary | ICD-10-CM | POA: Insufficient documentation

## 2018-05-18 DIAGNOSIS — O2621 Pregnancy care for patient with recurrent pregnancy loss, first trimester: Secondary | ICD-10-CM

## 2018-05-18 DIAGNOSIS — O99111 Other diseases of the blood and blood-forming organs and certain disorders involving the immune mechanism complicating pregnancy, first trimester: Secondary | ICD-10-CM | POA: Diagnosis not present

## 2018-05-18 DIAGNOSIS — O99119 Other diseases of the blood and blood-forming organs and certain disorders involving the immune mechanism complicating pregnancy, unspecified trimester: Secondary | ICD-10-CM

## 2018-05-18 LAB — HCG, QUANTITATIVE, PREGNANCY: hCG, Beta Chain, Quant, S: 407 m[IU]/mL — ABNORMAL HIGH (ref <5)

## 2018-05-18 NOTE — MAU Note (Signed)
Pt presents follow up BHCG, denies pain or bleeding. Information obtained using [employer] interpreter

## 2018-05-18 NOTE — MAU Provider Note (Signed)
None     Chief Complaint:  Follow-up   Kristyna Depoy is  32 y.o. [phone] at [redacted]w[redacted]d presents complaining of Follow-up She was seen in MAU 11/25  d/t hx of four SABs/Factor V, wants to start Lovenox.  (please see note).  Understands that an IUP must be established before it is indicated. Denies pain/bleeding/cramping.   Obstetrical/Gynecological History: OB History    Gravida  9   Para  4   Term  4   Preterm      AB  4   Living  4     SAB  4   TAB      Ectopic      Multiple  0   Live Births  4          Past Medical History: Past Medical History:  Diagnosis Date  . Factor V Leiden (HCC)   . Foot fracture, left   . Headache   . Wrist fracture    left    Past Surgical History: Past Surgical History:  Procedure Laterality Date  . APPENDECTOMY      Family History: Family History  Problem Relation Age of Onset  . Diabetes Mother   . Hypertension Mother   . Healthy Father   . Hearing loss Neg Hx     Social History: Social History   Tobacco Use  . Smoking status: Never Smoker  . Smokeless tobacco: Never Used  Substance Use Topics  . Alcohol use: No  . Drug use: No    Allergies: No Known Allergies  Meds:  Medications Prior to Admission  Medication Sig Dispense Refill Last Dose  . baclofen (LIORESAL) 10 MG tablet Take 1 tablet (10 mg total) by mouth daily as needed for muscle spasms. 30 each 0   . loratadine (CLARITIN) 10 MG tablet Take 1 tablet (10 mg total) by mouth daily. 30 tablet 11   . Prenatal Vit-Fe Fumarate-FA (PRENATAL VITAMIN) 27-0.8 MG TABS Take 1 tablet by mouth daily. 90 tablet 2     Review of Systems   Constitutional: Negative for fever and chills Eyes: Negative for visual disturbances Respiratory: Negative for shortness of breath, dyspnea Cardiovascular: Negative for chest pain or palpitations  Gastrointestinal: Negative for vomiting, diarrhea and constipation Genitourinary: Negative for dysuria and urgency Musculoskeletal:  Negative for back pain, joint pain, myalgias.  Normal ROM  Neurological: Negative for dizziness and headaches    Physical Exam  Blood pressure 98/67, pulse 91, resp. rate 18, last menstrual period 04/15/2018, SpO2 98 %, currently breastfeeding. GENERAL: Well-developed, well-nourished female in no acute distress.  LUNGS: Normal respiratory effort HEART: Regular rate and rhythm. EXTREMITIES:  Negative for pain/swelling     Labs: No results found for this or any previous visit (from the past 24 hour(s)). Imaging Studies:  US Ob Comp Less 14 Wks  Result Date: 05/15/2018 CLINICAL DATA:  Initial evaluation for acute back pain, early pregnancy. EXAM: OBSTETRIC <14 WK ULTRASOUND TECHNIQUE: Transabdominal ultrasound was performed for evaluation of the gestation as well as the maternal uterus and adnexal regions. COMPARISON:  None. FINDINGS: Intrauterine gestational sac: Not visualized. Endometrial complex measures 11.6 mm in thickness. Yolk sac:  Negative. Embryo:  Negative. Cardiac Activity: N/A Heart Rate: N/A bpm Subchorionic hemorrhage:  None visualized. Maternal uterus/adnexae: Left ovary normal in appearance. 2.1 x 1.8 x 2.3 cm right ovarian corpus luteal cyst. Trace free fluid within the pelvis. IMPRESSION: 1. Early pregnancy with no discrete IUP or adnexal mass identified. Finding is consistent with  a pregnancy of unknown anatomic location. Differential considerations include IUP to early to visualize, recent SAB, or possibly occult ectopic pregnancy. Close clinical monitoring with serial beta HCGs and close interval follow-up ultrasound recommended as clinically warranted. 2. 2.3 cm right ovarian corpus luteal cyst. 3. No other acute maternal uterine or adnexal abnormality identified. Electronically Signed   By: Rise MuBenjamin  McClintock M.D.   On: 05/15/2018 20:29    Assessment: Quenna Mcevers is  32 y.o. [id number] at 1016w5d presents with repeat HCG.  Plan: Call w/HCG results.  Outpt f/u US ordered for  next Th or Fri (scheduling to call her) and meet w/WOC provider afterwards.  Pt strongly desires to start Lovenox once IUP is established, already on ASA 81mg .    Addendum: 2:10 PM HCG 407 (up from 100 on 11/25)  Pt called and given results. Aware that US scheduling should call her tomorrow or Monday to set up an US for Thursday or Friday of next week.   iPad Arabic interpreter for all patient interaction.  Scarlette CalicoFrances Cresenzo-Dishmon 11/28/20191:24 PM

## 2018-05-25 ENCOUNTER — Ambulatory Visit (INDEPENDENT_AMBULATORY_CARE_PROVIDER_SITE_OTHER): Payer: Self-pay

## 2018-05-25 ENCOUNTER — Ambulatory Visit (HOSPITAL_COMMUNITY)
Admission: RE | Admit: 2018-05-25 | Discharge: 2018-05-25 | Disposition: A | Discharge status: Home / Self Care | Payer: Medicaid Other | Source: Ambulatory Visit | Attending: Advanced Practice Midwife | Admitting: Advanced Practice Midwife

## 2018-05-25 DIAGNOSIS — O99119 Other diseases of the blood and blood-forming organs and certain disorders involving the immune mechanism complicating pregnancy, unspecified trimester: Secondary | ICD-10-CM

## 2018-05-25 DIAGNOSIS — O3680X0 Pregnancy with inconclusive fetal viability, not applicable or unspecified: Secondary | ICD-10-CM

## 2018-05-25 DIAGNOSIS — Z3A01 Less than 8 weeks gestation of pregnancy: Secondary | ICD-10-CM | POA: Diagnosis not present

## 2018-05-25 DIAGNOSIS — Z3200 Encounter for pregnancy test, result unknown: Secondary | ICD-10-CM | POA: Diagnosis not present

## 2018-05-25 DIAGNOSIS — D6851 Activated protein C resistance: Secondary | ICD-10-CM | POA: Diagnosis not present

## 2018-05-25 DIAGNOSIS — O99111 Other diseases of the blood and blood-forming organs and certain disorders involving the immune mechanism complicating pregnancy, first trimester: Secondary | ICD-10-CM | POA: Diagnosis not present

## 2018-05-25 DIAGNOSIS — O2621 Pregnancy care for patient with recurrent pregnancy loss, first trimester: Secondary | ICD-10-CM | POA: Insufficient documentation

## 2018-05-25 NOTE — Progress Notes (Signed)
Pt here for US results, reviewed with Provider Dr. Vergie LivingPickens , states everything is progressing & for pt to come back for another US in 5 to 7 days. US has been scheduled, gave date & time. Pt concerned of taking Blood thinners. Dr Vergie LivingPickens states her Medical history will have to be reviewed & she will need to go over with a Dr. Rock NephewPt verbalized understanding.

## 2018-05-31 ENCOUNTER — Ambulatory Visit (INDEPENDENT_AMBULATORY_CARE_PROVIDER_SITE_OTHER): Payer: Medicaid Other

## 2018-05-31 ENCOUNTER — Encounter: Payer: Self-pay | Admitting: Obstetrics and Gynecology

## 2018-05-31 ENCOUNTER — Ambulatory Visit (HOSPITAL_COMMUNITY)
Admission: RE | Admit: 2018-05-31 | Discharge: 2018-05-31 | Disposition: A | Discharge status: Home / Self Care | Payer: Medicaid Other | Source: Ambulatory Visit | Attending: Obstetrics and Gynecology | Admitting: Obstetrics and Gynecology

## 2018-05-31 ENCOUNTER — Encounter: Payer: Self-pay | Admitting: Family Medicine

## 2018-05-31 DIAGNOSIS — Z712 Person consulting for explanation of examination or test findings: Secondary | ICD-10-CM

## 2018-05-31 DIAGNOSIS — N8311 Corpus luteum cyst of right ovary: Secondary | ICD-10-CM | POA: Insufficient documentation

## 2018-05-31 DIAGNOSIS — O3481 Maternal care for other abnormalities of pelvic organs, first trimester: Secondary | ICD-10-CM | POA: Diagnosis not present

## 2018-05-31 DIAGNOSIS — Z3A01 Less than 8 weeks gestation of pregnancy: Secondary | ICD-10-CM | POA: Insufficient documentation

## 2018-05-31 DIAGNOSIS — O3680X0 Pregnancy with inconclusive fetal viability, not applicable or unspecified: Secondary | ICD-10-CM | POA: Insufficient documentation

## 2018-05-31 MED ORDER — PROMETHAZINE HCL 25 MG PO TABS: 25.0000 mg | ORAL_TABLET | Freq: Four times a day (QID) | ORAL | 0 refills | Status: DC | PRN

## 2018-05-31 MED ORDER — ENOXAPARIN SODIUM 40 MG/0.4ML ~~LOC~~ SOLN: 40.0000 mg | Freq: Every day | SUBCUTANEOUS | 6 refills | Status: DC

## 2018-05-31 NOTE — Progress Notes (Signed)
I have reviewed the chart and agree with nursing staff's documentation of this patient's encounter. D/w RN to make NOB with patient to go over history and need for anti-coagulation given her history of FVL hetero.   [place] Bingharlie Shakina Choy, MD 05/31/2018 8:27 AM

## 2018-05-31 NOTE — Progress Notes (Signed)
Interpreter Lorene DySarra Gabriella- pt here today for US results.  Pt informed that she has a viable pregnancy.  Pt stated that she was to start Lovenox injections due to her condition.  Per chart review pt has Factor 5.  Notified Dr. Marice Potterove who recommended to place order for Lovenox injections.  Lovenox e-prescribed.  Pt c/o nausea.  Per protocol, Phenergan 25 mg tablet ordered.  Medications reconciled.  Pt given list of medications that is safe to take in pregnancy.  Pt stated thank you and did not have any other questions.

## 2018-06-02 NOTE — Progress Notes (Signed)
I have reviewed this chart and agree with the RN/CMA assessment and management.    Rafia Shedden C Jaeden Westbay, MD, FACOG Attending Physician, [facility]  

## 2018-06-21 NOTE — L&D Delivery Note (Signed)
PatientAbagale Turner MRN: [id number]  GBS status: Negative  Patient is a 33 y.o. now [id number] s/p NSVD at [redacted]w[redacted]d, who was admitted for IOL for A2GDM. AROM 1h 81m prior to delivery with clear fluid.   Delivery Note At 5:16 AM a viable female was delivered via Vaginal, Spontaneous (Presentation: LOA).  APGAR: 9, 9; weight pending.   Placenta status: Spontaneous, Intact.  Cord: 3 vessels.  Anesthesia: None Episiotomy: None Lacerations: None Suture Repair: None Est. Blood Loss (mL): 100  Arrived to bedside as patient was 9.5 cm in dilation and felt like she needed to push. Patient continued to breathe through strong contractions and started involuntarily pushing. Patient noted to be crowning and was subsequently positioned in bed for delivery. Head delivered LOA. Loose nuchal cord present and reduced. Shoulder and body delivered in usual fashion. Infant with spontaneous cry, placed on mother's abdomen, dried and bulb suctioned. Cord clamped x 2 after 1-minute delay, and cut by delivering provider. Cord blood drawn. Placenta delivered spontaneously with gentle cord traction. Fundus firm with massage and Pitocin. Perineum inspected and found to have no lacerations.  Mom to postpartum.  Baby to Couplet care / Skin to Skin.  Genia Del 01/14/2019, 5:29 AM

## 2018-07-05 ENCOUNTER — Encounter: Payer: Self-pay | Admitting: Family Medicine

## 2018-07-05 ENCOUNTER — Ambulatory Visit (INDEPENDENT_AMBULATORY_CARE_PROVIDER_SITE_OTHER): Payer: Medicaid Other | Admitting: Family Medicine

## 2018-07-05 VITALS — BP 107/64 | HR 92

## 2018-07-05 DIAGNOSIS — Z789 Other specified health status: Secondary | ICD-10-CM | POA: Diagnosis not present

## 2018-07-05 DIAGNOSIS — O0991 Supervision of high risk pregnancy, unspecified, first trimester: Secondary | ICD-10-CM | POA: Diagnosis not present

## 2018-07-05 DIAGNOSIS — O2621 Pregnancy care for patient with recurrent pregnancy loss, first trimester: Secondary | ICD-10-CM

## 2018-07-05 DIAGNOSIS — O0990 Supervision of high risk pregnancy, unspecified, unspecified trimester: Secondary | ICD-10-CM

## 2018-07-05 DIAGNOSIS — D6851 Activated protein C resistance: Secondary | ICD-10-CM

## 2018-07-05 DIAGNOSIS — O219 Vomiting of pregnancy, unspecified: Secondary | ICD-10-CM

## 2018-07-05 DIAGNOSIS — Z23 Encounter for immunization: Secondary | ICD-10-CM

## 2018-07-05 DIAGNOSIS — O2622 Pregnancy care for patient with recurrent pregnancy loss, second trimester: Secondary | ICD-10-CM

## 2018-07-05 DIAGNOSIS — O99111 Other diseases of the blood and blood-forming organs and certain disorders involving the immune mechanism complicating pregnancy, first trimester: Secondary | ICD-10-CM | POA: Diagnosis not present

## 2018-07-05 DIAGNOSIS — R000 Tachycardia, unspecified: Secondary | ICD-10-CM

## 2018-07-05 DIAGNOSIS — O99119 Other diseases of the blood and blood-forming organs and certain disorders involving the immune mechanism complicating pregnancy, unspecified trimester: Principal | ICD-10-CM

## 2018-07-05 LAB — POCT URINALYSIS DIP (DEVICE)
BILIRUBIN URINE: NEGATIVE
Glucose, UA: NEGATIVE mg/dL
HGB URINE DIPSTICK: NEGATIVE
Ketones, ur: NEGATIVE mg/dL
Leukocytes, UA: NEGATIVE
NITRITE: NEGATIVE
PH: 5.5 (ref 5.0–8.0)
PROTEIN: NEGATIVE mg/dL
Specific Gravity, Urine: >=1.03 (ref 1.005–1.030)
UROBILINOGEN UA: 0.2 mg/dL (ref 0.0–1.0)

## 2018-07-05 MED ORDER — DOXYLAMINE-PYRIDOXINE 10-10 MG PO TBEC: 1.0000 | DELAYED_RELEASE_TABLET | Freq: Three times a day (TID) | ORAL | 3 refills | Status: DC

## 2018-07-05 MED ORDER — ASPIRIN EC 81 MG PO TBEC: 81.0000 mg | DELAYED_RELEASE_TABLET | Freq: Every day | ORAL | 3 refills | Status: DC

## 2018-07-05 NOTE — Progress Notes (Signed)
Subjective:   Sonya Turner is a 33 y.o. [id number] at [redacted]w[redacted]d by LMP, early ultrasound being seen today for her first obstetrical visit.  Her obstetrical history is significant for factor V Leiden heterozygote. Patient does intend to breast feed. Pregnancy history fully reviewed.  Patient reports fatigue, nausea, vomiting and palpitations.  HISTORY: OB History  Gravida Para Term Preterm AB Living  9 4 4  0 4 4  SAB TAB Ectopic Multiple Live Births  4 0 0 0 4    # Outcome Date GA Lbr Len/2nd Weight Sex Delivery Anes PTL Lv  9 Current           8 Term 09/06/15 [redacted]w[redacted]d 18:24 / 00:07 7 lb 5.3 oz (3.325 kg) F Vag-Spont None  LIV     Name: Turner,GIRL Sonya     Apgar1: 9  Apgar5: 9  7 Term 01/16/14    F Vag-Spont   LIV  6 Term 06/22/11    M Vag-Spont   LIV  5 Term 06/21/09    F Vag-Spont   LIV  4 SAB           3 SAB           2 SAB           1 SAB             Past Medical History:  Diagnosis Date  . Factor V Leiden (HCC)   . Foot fracture, left   . Headache   . Wrist fracture    left   Past Surgical History:  Procedure Laterality Date  . APPENDECTOMY     Family History  Problem Relation Age of Onset  . Diabetes Mother   . Hypertension Mother   . Healthy Father   . Hearing loss Neg Hx    Social History   Tobacco Use  . Smoking status: Never Smoker  . Smokeless tobacco: Never Used  Substance Use Topics  . Alcohol use: No  . Drug use: No   No Known Allergies Current Outpatient Medications on File Prior to Visit  Medication Sig Dispense Refill  . enoxaparin (LOVENOX) 40 MG/0.4ML injection Inject 0.4 mLs (40 mg total) into the skin daily. 30 Syringe 6  . Prenatal Vit-Fe Fumarate-FA (PRENATAL VITAMIN) 27-0.8 MG TABS Take 1 tablet by mouth daily. 90 tablet 2  . promethazine (PHENERGAN) 25 MG tablet Take 1 tablet (25 mg total) by mouth every 6 (six) hours as needed for nausea or vomiting. 30 tablet 0   No current facility-administered medications on file prior to visit.       Exam   Vitals:   07/05/18 1040  BP: 107/64  Pulse: 92   Fetal Heart Rate (bpm): 165  System: General: well-developed, well-nourished female in no acute distress   Skin: normal coloration and turgor, no rashes   Neurologic: oriented, normal, negative, normal mood   Extremities: normal strength, tone, and muscle mass, ROM of all joints is normal   HEENT PERRLA, extraocular movement intact and sclera clear, anicteric   Mouth/Teeth mucous membranes moist, pharynx normal without lesions and dental hygiene good   Neck supple and no masses   Cardiovascular: regular rate and rhythm   Respiratory:  no respiratory distress, normal breath sounds   Abdomen: soft, non-tender; bowel sounds normal; no masses,  no organomegaly     Assessment:   Pregnancy: [id number] Patient Active Problem List   Diagnosis Date Noted  . Heterozygous factor V Leiden affecting pregnancy,  antepartum (HCC) 05/29/2015    Priority: Medium  . Four previous miscarriages, affecting care of mother in second trimester, antepartum 05/29/2015    Priority: Medium  . Supervision of high risk pregnancy, antepartum 07/05/2018  . Knee pain 07/29/2017  . Chronic headaches 07/29/2017  . Refugee health examination 07/29/2017  . Language barrier 05/29/2015     Plan:  1. Supervision of high risk pregnancy, antepartum Declined genetics Declined pap and breast check today--f/u next visit. - Culture, OB Urine - Flu Vaccine QUAD 36+ mos IM - GC/Chlamydia probe amp ([place])not at [facility] - Obstetric Panel, Including HIV - Inheritest(R) CF/SMA Panel - Hemoglobinopathy Evaluation - [place] MFM OB DETAIL +14 WK; Future  2. Heterozygous factor V Leiden affecting pregnancy, antepartum (HCC) Continue Lovenox begin ASA Has had multiple successful pregnancies with this intervention - aspirin EC 81 MG tablet; Take 1 tablet (81 mg total) by mouth daily.  Dispense: 90 tablet; Refill: 3  3. Four previous miscarriages, affecting care  of mother in second trimester, antepartum   4. Language barrier Arabic in-person interpreter: Feryal used   5. Rapid heart beat Check Thyroid - TSH  6. Nausea and vomiting of pregnancy, antepartum Add diclegis, phenergan is not working Discussed preventive nature of meds. - Doxylamine-Pyridoxine (DICLEGIS) 10-10 MG TBEC; Take 1 tablet by mouth 3 (three) times daily. 2 po q hs, 1 q am and 1 at noon if needed  Dispense: 100 tablet; Refill: 3   Initial labs drawn. Continue prenatal vitamins. Genetic Screening discussed, NIPS: declined. Ultrasound discussed; fetal anatomic survey: discussed at 18 wks. Problem list reviewed and updated.  Routine obstetric precautions reviewed. Return in 4 weeks (on 08/02/2018).

## 2018-07-05 NOTE — Patient Instructions (Signed)

## 2018-07-07 LAB — URINE CULTURE, OB REFLEX

## 2018-07-07 LAB — CULTURE, OB URINE

## 2018-07-19 LAB — OBSTETRIC PANEL, INCLUDING HIV
Antibody Screen: NEGATIVE
BASOS ABS: 0.1 10*3/uL (ref 0.0–0.2)
Basos: 1 %
EOS (ABSOLUTE): 0.1 10*3/uL (ref 0.0–0.4)
EOS: 1 %
HEMOGLOBIN: 12.8 g/dL (ref 11.1–15.9)
HEP B S AG: NEGATIVE
HIV Screen 4th Generation wRfx: NONREACTIVE
Hematocrit: 35.5 % (ref 34.0–46.6)
IMMATURE GRANULOCYTES: 0 %
Immature Grans (Abs): 0 10*3/uL (ref 0.0–0.1)
Lymphocytes Absolute: 2 10*3/uL (ref 0.7–3.1)
Lymphs: 33 %
MCH: 30.4 pg (ref 26.6–33.0)
MCHC: 36.1 g/dL — ABNORMAL HIGH (ref 31.5–35.7)
MCV: 84 fL (ref 79–97)
MONOS ABS: 0.3 10*3/uL (ref 0.1–0.9)
Monocytes: 5 %
NEUTROS PCT: 60 %
Neutrophils Absolute: 3.7 10*3/uL (ref 1.4–7.0)
Platelets: 312 10*3/uL (ref 150–450)
RBC: 4.21 x10E6/uL (ref 3.77–5.28)
RDW: 13.1 % (ref 11.7–15.4)
RH TYPE: POSITIVE
RPR: NONREACTIVE
Rubella Antibodies, IGG: 8.67 index (ref >0.99)
WBC: 6.2 10*3/uL (ref 3.4–10.8)

## 2018-07-19 LAB — HEMOGLOBINOPATHY EVALUATION
Ferritin: 85 ng/mL (ref 15–150)
HGB A: 97.3 % (ref 96.4–98.8)
HGB C: 0 %
HGB F QUANT: 0 % (ref 0.0–2.0)
Hgb A2 Quant: 2.7 % (ref 1.8–3.2)
Hgb S: 0 %
Hgb Solubility: NEGATIVE
Hgb Variant: 0 %

## 2018-07-19 LAB — INHERITEST(R) CF/SMA PANEL

## 2018-07-19 LAB — TSH: TSH: 2.17 u[IU]/mL (ref 0.450–4.500)

## 2018-07-21 ENCOUNTER — Inpatient Hospital Stay (HOSPITAL_COMMUNITY): Payer: Medicaid Other

## 2018-07-21 ENCOUNTER — Inpatient Hospital Stay (HOSPITAL_COMMUNITY)
Admission: AD | Admit: 2018-07-21 | Discharge: 2018-07-21 | Disposition: A | Discharge status: Home / Self Care | Payer: Medicaid Other | Source: Ambulatory Visit | Attending: Obstetrics and Gynecology | Admitting: Obstetrics and Gynecology

## 2018-07-21 ENCOUNTER — Other Ambulatory Visit: Payer: Self-pay

## 2018-07-21 ENCOUNTER — Encounter (HOSPITAL_COMMUNITY): Payer: Self-pay | Admitting: *Deleted

## 2018-07-21 DIAGNOSIS — Z3A13 13 weeks gestation of pregnancy: Secondary | ICD-10-CM | POA: Diagnosis not present

## 2018-07-21 DIAGNOSIS — O26891 Other specified pregnancy related conditions, first trimester: Secondary | ICD-10-CM | POA: Diagnosis not present

## 2018-07-21 DIAGNOSIS — M545 Low back pain: Secondary | ICD-10-CM | POA: Insufficient documentation

## 2018-07-21 DIAGNOSIS — O209 Hemorrhage in early pregnancy, unspecified: Secondary | ICD-10-CM | POA: Diagnosis not present

## 2018-07-21 DIAGNOSIS — O26851 Spotting complicating pregnancy, first trimester: Secondary | ICD-10-CM | POA: Diagnosis not present

## 2018-07-21 DIAGNOSIS — O0990 Supervision of high risk pregnancy, unspecified, unspecified trimester: Secondary | ICD-10-CM

## 2018-07-21 DIAGNOSIS — O26899 Other specified pregnancy related conditions, unspecified trimester: Secondary | ICD-10-CM | POA: Diagnosis present

## 2018-07-21 DIAGNOSIS — R109 Unspecified abdominal pain: Secondary | ICD-10-CM | POA: Diagnosis not present

## 2018-07-21 HISTORY — DX: Tachycardia, unspecified: R00.0

## 2018-07-21 LAB — URINALYSIS, ROUTINE W REFLEX MICROSCOPIC
BILIRUBIN URINE: NEGATIVE
Glucose, UA: NEGATIVE mg/dL
Hgb urine dipstick: NEGATIVE
Ketones, ur: NEGATIVE mg/dL
Leukocytes, UA: NEGATIVE
Nitrite: NEGATIVE
PH: 7.5 (ref 5.0–8.0)
Protein, ur: NEGATIVE mg/dL
SPECIFIC GRAVITY, URINE: 1.015 (ref 1.005–1.030)

## 2018-07-21 NOTE — Discharge Instructions (Signed)

## 2018-07-21 NOTE — MAU Note (Signed)
Is preg having back ache.  Has seen a little bit of blood. Today and 4 days ago. Saw when she wipes.  Back pain is constant.  Lower abd is hurting with urination.

## 2018-07-21 NOTE — MAU Provider Note (Signed)
History     CSN: [id number]  Arrival date and time: 07/21/18 1637   First Provider Initiated Contact with Patient 07/21/18 1825      Chief Complaint  Patient presents with  . Back Pain  . Vaginal Bleeding   HPI  Ms.  Sonya Turner is a 33 y.o. year old G4P4044 female at [redacted]w[redacted]d weeks gestation who presents to MAU reporting lower back pain throughout the entire pregnancy. She reports seeing a little bit of blood today & 4 days ago (only 2 episodes). She also complains of dysuria and gas pain. She is requesting an U/S to "see if the baby is ok."   Past Medical History:  Diagnosis Date  . Factor V Leiden (HCC)   . Foot fracture, left   . Headache   . Tachycardia   . Wrist fracture    left    Past Surgical History:  Procedure Laterality Date  . APPENDECTOMY      Family History  Problem Relation Age of Onset  . Diabetes Mother   . Hypertension Mother   . Healthy Father   . Hearing loss Neg Hx     Social History   Tobacco Use  . Smoking status: Never Smoker  . Smokeless tobacco: Never Used  Substance Use Topics  . Alcohol use: No  . Drug use: No    Allergies: No Known Allergies  Medications Prior to Admission  Medication Sig Dispense Refill Last Dose  . aspirin EC 81 MG tablet Take 1 tablet (81 mg total) by mouth daily. 90 tablet 3   . Doxylamine-Pyridoxine (DICLEGIS) 10-10 MG TBEC Take 1 tablet by mouth 3 (three) times daily. 2 po q hs, 1 q am and 1 at noon if needed 100 tablet 3   . enoxaparin (LOVENOX) 40 MG/0.4ML injection Inject 0.4 mLs (40 mg total) into the skin daily. 30 Syringe 6 Taking  . Prenatal Vit-Fe Fumarate-FA (PRENATAL VITAMIN) 27-0.8 MG TABS Take 1 tablet by mouth daily. 90 tablet 2 Taking  . promethazine (PHENERGAN) 25 MG tablet Take 1 tablet (25 mg total) by mouth every 6 (six) hours as needed for nausea or vomiting. 30 tablet 0 Taking    Review of Systems  Constitutional: Negative.   HENT: Negative.   Eyes: Negative.   Respiratory:  Negative.   Cardiovascular: Negative.   Gastrointestinal: Positive for nausea.  Endocrine: Negative.   Genitourinary: Positive for vaginal bleeding (earlier today and also 4 days; none now).  Musculoskeletal: Positive for back pain (through entire pregnancy).  Skin: Negative.   Allergic/Immunologic: Negative.   Neurological: Negative.   Hematological: Negative.   Psychiatric/Behavioral: Negative.    Physical Exam   Blood pressure 105/68, pulse (!) 101, temperature 98.2 F (36.8 C), temperature source Oral, resp. rate 16, weight 57.4 kg, last menstrual period 04/15/2018, SpO2 97 %, currently breastfeeding.  Physical Exam  Nursing note and vitals reviewed. Constitutional: She is oriented to person, place, and time. She appears well-developed and well-nourished.  HENT:  Head: Normocephalic and atraumatic.  Eyes: Pupils are equal, round, and reactive to light.  Neck: Normal range of motion.  Cardiovascular: Normal rate and regular rhythm.  Respiratory: Effort normal.  GI: Soft.  Genitourinary:    Genitourinary Comments: patient refused   Musculoskeletal: Normal range of motion.  Neurological: She is alert and oriented to person, place, and time.  Skin: Skin is warm and dry.  Psychiatric: She has a normal mood and affect. Her behavior is normal. Judgment and thought content normal.  MAU Course  Procedures  MDM CCUA FHTs by doppler: 162 bpm  Offered informal U/S --> patient requesting to know reason for bleeding; notified unable to detect problems with informal U/S --> reassurance given that   OB <14 wks U/S --> patient requesting to know the gender of the fetus  Results for orders placed or performed during the hospital encounter of 07/21/18 (from the past 24 hour(s))  Urinalysis, Routine w reflex microscopic     Status: None   Collection Time: 07/21/18  5:37 PM  Result Value Ref Range   Color, Urine YELLOW YELLOW   APPearance CLEAR CLEAR   Specific Gravity, Urine 1.015  1.005 - 1.030   pH 7.5 5.0 - 8.0   Glucose, UA NEGATIVE NEGATIVE mg/dL   Hgb urine dipstick NEGATIVE NEGATIVE   Bilirubin Urine NEGATIVE NEGATIVE   Ketones, ur NEGATIVE NEGATIVE mg/dL   Protein, ur NEGATIVE NEGATIVE mg/dL   Nitrite NEGATIVE NEGATIVE   Leukocytes, UA NEGATIVE NEGATIVE   US Ob Comp Less 14 Wks  Result Date: 07/21/2018 CLINICAL DATA:  Vaginal spotting. Back pain. Pregnant patient. Patient is 13 weeks and 1 day pregnant based on her initial obstetrical ultrasound. EXAM: OBSTETRIC <14 WK ULTRASOUND TECHNIQUE: Transabdominal ultrasound was performed for evaluation of the gestation as well as the maternal uterus and adnexal regions. COMPARISON:  05/31/2018 FINDINGS: Intrauterine gestational sac: Single Yolk sac:  Not Visualized. Embryo:  Visualized. Cardiac Activity: Visualized. Heart Rate: 161 bpm. CRL:   7.86 cm   13 w 6 d                  [place] EDC: 01/20/2019 Subchorionic hemorrhage:  None visualized. Maternal uterus/adnexae: No uterine mass or abnormality. Cervix is closed. Two right ovarian cysts, largest measuring 3.6 cm. Normal left ovary. No adnexal masses. No pelvic free fluid. IMPRESSION: 1. Single live intrauterine pregnancy with a measured gestational age of [redacted] weeks and 6 days, showing normal interval growth since the prior ultrasound. 2. No pregnancy complication or acute finding. Electronically Signed   By: Amie Portland M.D.   On: 07/21/2018 19:53    Assessment and Plan  Abdominal pain affecting pregnancy  - Information provided on abd pain in pregnancy  - Discharge patient - Advised the purpose of the U/S was NOT for gender, but for emergent purposes - Keep scheduled appt on 08/02/2018 - Patient verbalized an understanding of the plan of care and agrees.     Raelyn Mora, MSN, CNM 07/21/2018, 6:25 PM

## 2018-08-02 ENCOUNTER — Ambulatory Visit (INDEPENDENT_AMBULATORY_CARE_PROVIDER_SITE_OTHER): Payer: Medicaid Other | Admitting: Obstetrics & Gynecology

## 2018-08-02 VITALS — BP 107/74 | HR 98

## 2018-08-02 DIAGNOSIS — Z3A15 15 weeks gestation of pregnancy: Secondary | ICD-10-CM

## 2018-08-02 DIAGNOSIS — O0990 Supervision of high risk pregnancy, unspecified, unspecified trimester: Secondary | ICD-10-CM

## 2018-08-02 DIAGNOSIS — O0991 Supervision of high risk pregnancy, unspecified, first trimester: Secondary | ICD-10-CM

## 2018-08-02 DIAGNOSIS — Z789 Other specified health status: Secondary | ICD-10-CM

## 2018-08-02 DIAGNOSIS — O99119 Other diseases of the blood and blood-forming organs and certain disorders involving the immune mechanism complicating pregnancy, unspecified trimester: Secondary | ICD-10-CM

## 2018-08-02 DIAGNOSIS — Z758 Other problems related to medical facilities and other health care: Secondary | ICD-10-CM

## 2018-08-02 DIAGNOSIS — D6851 Activated protein C resistance: Secondary | ICD-10-CM

## 2018-08-02 NOTE — Progress Notes (Signed)
   PRENATAL VISIT NOTE  Subjective:  Sonya Turner is a 33 y.o. [id number] at [redacted]w[redacted]d being seen today for ongoing prenatal care.  She is currently monitored for the following issues for this high-risk pregnancy and has Heterozygous factor V Leiden affecting pregnancy, antepartum (HCC); Language barrier; Four previous miscarriages, affecting care of mother in second trimester, antepartum; Knee pain; Chronic headaches; Refugee health examination; Supervision of high risk pregnancy, antepartum; and Abdominal pain affecting pregnancy on their problem list.  Patient reports no complaints.  Contractions: Not present. Vag. Bleeding: None.  Movement: Absent. Denies leaking of fluid.   The following portions of the patient's history were reviewed and updated as appropriate: allergies, current medications, past family history, past medical history, past social history, past surgical history and problem list. Problem list updated.  Objective:   Vitals:   08/02/18 0950  BP: 107/74  Pulse: 98    Fetal Status: Fetal Heart Rate (bpm): 154   Movement: Absent     General:  Alert, oriented and cooperative. Patient is in no acute distress.  Skin: Skin is warm and dry. No rash noted.   Cardiovascular: Normal heart rate noted  Respiratory: Normal respiratory effort, no problems with respiration noted  Abdomen: Soft, gravid, appropriate for gestational age.  Pain/Pressure: Present     Pelvic: Cervical exam deferred        Extremities: Normal range of motion.     Mental Status: Normal mood and affect. Normal behavior. Normal judgment and thought content.   Assessment and Plan:  Pregnancy: [id number] at [redacted]w[redacted]d  1. Supervision of high risk pregnancy, antepartum - anatomy scan scheduled for next week  2. Language barrier - live interpretor present, Arabic  3. Heterozygous factor V Leiden affecting pregnancy, antepartum (HCC) - on lovenox  Preterm labor symptoms and general obstetric precautions including but not  limited to vaginal bleeding, contractions, leaking of fluid and fetal movement were reviewed in detail with the patient. Please refer to After Visit Summary for other counseling recommendations.  No follow-ups on file.  Future Appointments  Date Time Provider Department Center  08/25/2018  8:30 AM [facility] [place] 1 WH-[facility]    Allie Bossier, MD

## 2018-08-02 NOTE — Progress Notes (Signed)
Pt reports brown discharge.

## 2018-08-25 ENCOUNTER — Ambulatory Visit (HOSPITAL_COMMUNITY): Payer: Medicaid Other | Admitting: *Deleted

## 2018-08-25 ENCOUNTER — Encounter (HOSPITAL_COMMUNITY): Payer: Self-pay

## 2018-08-25 ENCOUNTER — Ambulatory Visit (HOSPITAL_COMMUNITY)
Admission: RE | Admit: 2018-08-25 | Discharge: 2018-08-25 | Disposition: A | Discharge status: Home / Self Care | Payer: Medicaid Other | Source: Ambulatory Visit | Attending: Family Medicine | Admitting: Family Medicine

## 2018-08-25 VITALS — BP 108/66 | HR 87 | Wt 133.4 lb

## 2018-08-25 DIAGNOSIS — O0990 Supervision of high risk pregnancy, unspecified, unspecified trimester: Secondary | ICD-10-CM | POA: Diagnosis not present

## 2018-08-25 DIAGNOSIS — Z363 Encounter for antenatal screening for malformations: Secondary | ICD-10-CM

## 2018-08-25 DIAGNOSIS — Z3A18 18 weeks gestation of pregnancy: Secondary | ICD-10-CM | POA: Diagnosis not present

## 2018-08-25 DIAGNOSIS — O0992 Supervision of high risk pregnancy, unspecified, second trimester: Secondary | ICD-10-CM

## 2018-08-28 ENCOUNTER — Telehealth: Payer: Self-pay | Admitting: *Deleted

## 2018-08-28 NOTE — Telephone Encounter (Signed)
Received a voice message from this am stating she is calling for Aloise, states Smith has fever, cough, sore throat and wants to know what medications she can take.

## 2018-08-28 NOTE — Telephone Encounter (Signed)
I called Sonya Turner with [employer] [phone] and left a message I was returning her call and may call us back if needed.

## 2018-08-30 ENCOUNTER — Other Ambulatory Visit: Payer: Self-pay

## 2018-08-30 ENCOUNTER — Ambulatory Visit (INDEPENDENT_AMBULATORY_CARE_PROVIDER_SITE_OTHER): Payer: Medicaid Other | Admitting: Obstetrics & Gynecology

## 2018-08-30 VITALS — BP 105/65 | HR 109 | Wt 131.3 lb

## 2018-08-30 DIAGNOSIS — D6851 Activated protein C resistance: Secondary | ICD-10-CM

## 2018-08-30 DIAGNOSIS — O99112 Other diseases of the blood and blood-forming organs and certain disorders involving the immune mechanism complicating pregnancy, second trimester: Secondary | ICD-10-CM

## 2018-08-30 DIAGNOSIS — O2622 Pregnancy care for patient with recurrent pregnancy loss, second trimester: Secondary | ICD-10-CM

## 2018-08-30 DIAGNOSIS — R6889 Other general symptoms and signs: Secondary | ICD-10-CM

## 2018-08-30 DIAGNOSIS — Z3A19 19 weeks gestation of pregnancy: Secondary | ICD-10-CM

## 2018-08-30 DIAGNOSIS — O0990 Supervision of high risk pregnancy, unspecified, unspecified trimester: Secondary | ICD-10-CM

## 2018-08-30 DIAGNOSIS — O99119 Other diseases of the blood and blood-forming organs and certain disorders involving the immune mechanism complicating pregnancy, unspecified trimester: Principal | ICD-10-CM

## 2018-08-30 NOTE — Patient Instructions (Addendum)
For cold and cough  Acetaminophen 3 regular strength every six hours Regular cough medicine  Return to office for any scheduled appointments. Call the office or go to the MAU at [facility] at [facility] if:  You begin to have strong, frequent contractions  Your water breaks.  Sometimes it is a big gush of fluid, sometimes it is just a trickle that keeps getting your panties wet or running down your legs  You have vaginal bleeding.  It is normal to have a small amount of spotting if your cervix was checked.   You do not feel your baby moving like normal.  If you do not, get something to eat and drink and lay down and focus on feeling your baby move.   If your baby is still not moving like normal, you should call the office or go to MAU.  Any other obstetric concerns.

## 2018-08-30 NOTE — Progress Notes (Signed)
PRENATAL VISIT NOTE  Subjective:  Sonya Turner is a 33 y.o. E4V4098 at [redacted]w[redacted]d being seen today for ongoing prenatal care.  Due to language barrier, an Arabic interpreter was present during the history-taking and subsequent discussion (and for part of the physical exam) with this patient.  She is currently monitored for the following issues for this high-risk pregnancy and has Heterozygous factor V Leiden affecting pregnancy, antepartum (HCC); Language barrier; Four previous miscarriages, affecting care of mother in second trimester, antepartum; Knee pain; Chronic headaches; Refugee health examination; Supervision of high risk pregnancy, antepartum; and Abdominal pain affecting pregnancy on their problem list.  Patient reports heartburn and cough. Everyone at home is sick. Reports occasional fevers, taking Tylenol as needed.  Contractions: Not present. Vag. Bleeding: None.  Movement: Present. Denies leaking of fluid.   The following portions of the patient's history were reviewed and updated as appropriate: allergies, current medications, past family history, past medical history, past social history, past surgical history and problem list.   Objective:   Vitals:   08/30/18 0949  BP: 105/65  Pulse: (!) 109  Weight: 131 lb 4.8 oz (59.6 kg)    Fetal Status: Fetal Heart Rate (bpm): 156   Movement: Present     General:  Alert, oriented and cooperative. Patient is in no acute distress.  Skin: Skin is warm and dry. No rash noted.   Cardiovascular: Normal heart rate noted  Respiratory: Normal respiratory effort, no problems with respiration noted  Abdomen: Soft, gravid, appropriate for gestational age.  Pain/Pressure: Absent     Pelvic: Cervical exam deferred        Extremities: Normal range of motion.     Mental Status: Normal mood and affect. Normal behavior. Normal judgment and thought content.   Imaging: [place] Mfm Ob Detail +14 Wk  Result Date: 08/28/2018  ----------------------------------------------------------------------  OBSTETRICS REPORT                    (Corrected Final 08/28/2018 08:29 am) ---------------------------------------------------------------------- Patient Info  ID #:       [id number]                          D.O.B.:  [birth year] (33 yrs)  Name:       Sonya Turner                      Visit Date: 08/25/2018 09:09 am ---------------------------------------------------------------------- Performed By  Performed By:     Apolonio Schneiders Sweat         Ref. Address:     [address]                                                             [place], [place]  [address]  Attending:        Noralee Space MD        [facility]  Referred By:      Reva Bores                    MD ---------------------------------------------------------------------- Orders   #  Description                          Code         Ordered By   1  [place] MFM OB DETAIL +14 WK              76811.01     TANYA PRATT  ----------------------------------------------------------------------   #  Order #                    Accession #                 Episode #   1  [id number]                  [id number]                  [id number]  ---------------------------------------------------------------------- Indications   Encounter for antenatal screening for          Z36.3   malformations (declined NIPS)   Factor V Leiden mutation affecting             O99.119   pregnancy   [redacted] weeks gestation of pregnancy                Z3A.18  ---------------------------------------------------------------------- Fetal Evaluation  Num Of Fetuses:         1  Fetal Heart Rate(bpm):  144  Cardiac Activity:       Observed  Presentation:           Variable  Placenta:               Anterior   P. Cord Insertion:      Visualized  Amniotic Fluid  AFI FV:      Within normal limits                              Largest Pocket(cm)                              5.26 ---------------------------------------------------------------------- Biometry  BPD:        43  mm     G. Age:  19w 0d         58  %    CI:        72.21   %    70 - 86  FL/HC:      16.8   %    16.1 - 18.3  HC:       161   mm     G. Age:  18w 6d         45  %    HC/AC:      1.18        1.09 - 1.39  AC:      136.3  mm     G. Age:  19w 0d         53  %    FL/BPD:     62.8   %  FL:         27  mm     G. Age:  18w 1d         23  %    FL/AC:      19.8   %    20 - 24  HUM:      26.8  mm     G. Age:  18w 4d         41  %  CER:      18.7  mm     G. Age:  18w 2d         34  %  Est. FW:     254  gm      0 lb 9 oz     42  % ---------------------------------------------------------------------- OB History  Gravidity:    9         Term:   4         SAB:   4  Living:       4 ---------------------------------------------------------------------- Gestational Age  LMP:           18w 6d        Date:  04/15/18                 EDD:   01/20/19  U/S Today:     18w 5d                                        EDD:   01/21/19  Best:          18w 6d     Det. By:  LMP  (04/15/18)          EDD:   01/20/19 ---------------------------------------------------------------------- 2nd Trimester Genetic Sonogram - Trisomy 21 Screening        NFT:       4.4  mm ---------------------------------------------------------------------- Anatomy  Cranium:               Appears normal         Aortic Arch:            Appears normal  Cavum:                 Appears normal         Ductal Arch:            Appears normal  Ventricles:            Appears normal         Diaphragm:              Appears normal  Choroid Plexus:        Appears normal         Stomach:  Appears normal, left                                                                         sided  Cerebellum:            Appears normal         Abdomen:                Appears normal  Posterior Fossa:       Appears normal         Abdominal Wall:         Appears nml (cord                                                                        insert, abd wall)  Nuchal Fold:           Appears normal         Cord Vessels:           Appears normal (3                                                                        vessel cord)  Face:                  Appears normal         Kidneys:                Appear normal                         (orbits)  Lips:                  Appears normal         Bladder:                Appears normal  Thoracic:              Appears normal         Spine:                  Appears normal  Heart:                 Appears normal         Upper Extremities:      Appears normal                         (4CH, axis, and                         situs)  RVOT:  Appears normal         Lower Extremities:      Appears normal  LVOT:                  Appears normal  Other:  Female gender. Nasal bone visualized. ---------------------------------------------------------------------- Cervix Uterus Adnexa  Cervix  Normal appearance by transabdominal scan.  Uterus  No abnormality visualized.  Left Ovary  Not visualized.  Right Ovary  Not visualized.  Cul De Sac  No free fluid seen.  Adnexa  No abnormality visualized. ---------------------------------------------------------------------- Impression  Ms. Mdlal, G9 P4, is here for fetal anatomy scan. She had  opted not to screen for fetal aneuploidies. Patient had 4 SABs  followed by 4 term vaginal deliveries. Patient also has Factor  V Leiden heterozygous mutation and takes lovenox. She took  heparin prophylaxis in all her successful pregnancies. She  reports no chronic medical conditions.  We performed a fetal anatomy scan. No markers of  aneuploidies or fetal structural defects are seen. Fetal  biometry is consistent  with her previously-established dates.  Amniotic fluid is normal and good fetal activity is seen.  Patient understands the limitations of ultrasound in detecting  fetal anomalies.  I explained findings and counseled her with help of language  interpreter (Arabic). 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 In the  absence of personal history of venous thromboembolism, she  does not require anticoagulation during pregnancy. If patient  has cesarean delivery, postpartum anticoagulation (6 weeks)  may be given.  If the patient wants to continue heparin prophylaxis, it may be  continued. ---------------------------------------------------------------------- Recommendations  Follow-up as clinically indicated. ----------------------------------------------------------------------                       Noralee Spaceavi Shankar, MD Electronically Signed Corrected Final Report  08/28/2018 08:29 am ----------------------------------------------------------------------   Assessment and Plan:  Pregnancy: [id number] at 7784w4d 1. Flu-like symptoms Temp 98.8 here today.  Talked about OTC medications to take, and [facility].   2. Four previous miscarriages, affecting care of mother in second trimester, antepartum 3. Heterozygous factor V Leiden affecting pregnancy, antepartum (HCC) Continue Lovenox.   4. Supervision of high risk pregnancy, antepartum Preterm labor symptoms and general obstetric precautions including but not limited to vaginal bleeding, contractions, leaking of fluid and fetal movement were reviewed in detail with the patient. Please refer to After Visit Summary for other counseling recommendations.   Return in about 4 weeks (around 09/27/2018) for OB Visit (HOB).  No future appointments.  Jaynie CollinsUgonna Patrina Andreas, MD

## 2018-10-03 ENCOUNTER — Telehealth: Payer: Self-pay | Admitting: Obstetrics & Gynecology

## 2018-10-03 NOTE — Telephone Encounter (Signed)
Language line called in to notify the clinic an interrupter is assigned for the visit. If you'll double-click the envelope the interpreters name and number appears. The nurse or doctor will call the interrupter when the patient is ready for the visit. The assigned name and number are for this visit only. Please don't use for future visits as someone will be assigned.

## 2018-10-04 ENCOUNTER — Telehealth: Payer: Self-pay | Admitting: Obstetrics and Gynecology

## 2018-10-04 ENCOUNTER — Encounter: Payer: Medicaid Other | Admitting: Obstetrics and Gynecology

## 2018-10-04 NOTE — Telephone Encounter (Signed)
Called the patient with the interrupter id [phone]. Informed the patient of the rescheduled appointment. She started she does have access to blood pressure cuffs.Stated she is on the way to our clinic due to the appointment. Once she arrives we will call the interrupter to communicate with the patient. Postponed the appointment per Dr. Shawnie Pons.

## 2018-11-02 ENCOUNTER — Encounter: Payer: Medicaid Other | Admitting: Obstetrics and Gynecology

## 2018-11-02 ENCOUNTER — Other Ambulatory Visit: Payer: Medicaid Other

## 2018-11-03 ENCOUNTER — Telehealth: Payer: Self-pay | Admitting: Obstetrics and Gynecology

## 2018-11-03 NOTE — Telephone Encounter (Signed)
Called the patient to inform of the upcoming appointment on Monday. Also informed of fasting for the two hour labs. No food or drink the night before. Also informed of the new location.   Called the interrupter line for assistance Interrupter ID- [id number]

## 2018-11-06 ENCOUNTER — Encounter: Payer: Self-pay | Admitting: Obstetrics and Gynecology

## 2018-11-06 ENCOUNTER — Ambulatory Visit (INDEPENDENT_AMBULATORY_CARE_PROVIDER_SITE_OTHER): Payer: Medicaid Other | Admitting: Obstetrics and Gynecology

## 2018-11-06 ENCOUNTER — Other Ambulatory Visit: Payer: Medicaid Other

## 2018-11-06 ENCOUNTER — Other Ambulatory Visit: Payer: Self-pay

## 2018-11-06 VITALS — BP 104/71 | HR 104 | Temp 97.4°F | Wt 150.0 lb

## 2018-11-06 DIAGNOSIS — Z3A29 29 weeks gestation of pregnancy: Secondary | ICD-10-CM

## 2018-11-06 DIAGNOSIS — O0990 Supervision of high risk pregnancy, unspecified, unspecified trimester: Secondary | ICD-10-CM

## 2018-11-06 DIAGNOSIS — Z789 Other specified health status: Secondary | ICD-10-CM

## 2018-11-06 DIAGNOSIS — O2622 Pregnancy care for patient with recurrent pregnancy loss, second trimester: Secondary | ICD-10-CM

## 2018-11-06 DIAGNOSIS — D6851 Activated protein C resistance: Secondary | ICD-10-CM

## 2018-11-06 DIAGNOSIS — O99113 Other diseases of the blood and blood-forming organs and certain disorders involving the immune mechanism complicating pregnancy, third trimester: Secondary | ICD-10-CM

## 2018-11-06 MED ORDER — PRENATAL VITAMIN 27-0.8 MG PO TABS: 1.0000 | ORAL_TABLET | Freq: Every day | ORAL | 2 refills | Status: AC

## 2018-11-06 NOTE — Progress Notes (Signed)
Subjective:  Sonya Turner is a 33 y.o. H7S1423 at [redacted]w[redacted]d being seen today for ongoing prenatal care.  She is currently monitored for the following issues for this high-risk pregnancy and has Heterozygous factor V Leiden affecting pregnancy, antepartum (HCC); Language barrier; Four previous miscarriages, affecting care of mother in second trimester, antepartum; Chronic headaches; Refugee health examination; Supervision of high risk pregnancy, antepartum; and Abdominal pain affecting pregnancy on their problem list.  Patient reports general discomforts of pregnancy.  Contractions: Not present. Vag. Bleeding: None.  Movement: Present. Denies leaking of fluid.   The following portions of the patient's history were reviewed and updated as appropriate: allergies, current medications, past family history, past medical history, past social history, past surgical history and problem list. Problem list updated.  Objective:   Vitals:   11/06/18 1004  BP: 104/71  Pulse: (!) 104  Temp: (!) 97.4 F (36.3 C)  Weight: 68 kg    Fetal Status: Fetal Heart Rate (bpm): 140   Movement: Present     General:  Alert, oriented and cooperative. Patient is in no acute distress.  Skin: Skin is warm and dry. No rash noted.   Cardiovascular: Normal heart rate noted  Respiratory: Normal respiratory effort, no problems with respiration noted  Abdomen: Soft, gravid, appropriate for gestational age. Pain/Pressure: Absent     Pelvic:  Cervical exam deferred        Extremities: Normal range of motion.     Mental Status: Normal mood and affect. Normal behavior. Normal judgment and thought content.   Urinalysis:      Assessment and Plan:  Pregnancy: T5V2023 at [redacted]w[redacted]d  1. Supervision of high risk pregnancy, antepartum Stable Glucola today  2. Heterozygous factor V Leiden affecting pregnancy, antepartum (HCC) Stable Continue with Lovenox  3. Four previous miscarriages, affecting care of mother in second trimester,  antepartum   4. Language barrier Interrupter used during today's visit  Preterm labor symptoms and general obstetric precautions including but not limited to vaginal bleeding, contractions, leaking of fluid and fetal movement were reviewed in detail with the patient. Please refer to After Visit Summary for other counseling recommendations.  Return in about 2 weeks (around 11/20/2018) for OB visit, televisit.   Hermina Staggers, MD

## 2018-11-06 NOTE — Patient Instructions (Signed)

## 2018-11-06 NOTE — Progress Notes (Signed)
Arabic interpreter [employer] Arabic interpreter in person

## 2018-11-07 ENCOUNTER — Telehealth: Payer: Self-pay | Admitting: Student

## 2018-11-07 ENCOUNTER — Telehealth (INDEPENDENT_AMBULATORY_CARE_PROVIDER_SITE_OTHER): Payer: Medicaid Other

## 2018-11-07 DIAGNOSIS — O24419 Gestational diabetes mellitus in pregnancy, unspecified control: Secondary | ICD-10-CM

## 2018-11-07 LAB — GLUCOSE TOLERANCE, 2 HOURS W/ 1HR
Glucose, 1 hour: 157 mg/dL (ref 65–179)
Glucose, 2 hour: 169 mg/dL — ABNORMAL HIGH (ref 65–152)
Glucose, Fasting: 74 mg/dL (ref 65–91)

## 2018-11-07 LAB — CBC
Hematocrit: 37.2 % (ref 34.0–46.6)
Hemoglobin: 12.7 g/dL (ref 11.1–15.9)
MCH: 29.5 pg (ref 26.6–33.0)
MCHC: 34.1 g/dL (ref 31.5–35.7)
MCV: 86 fL (ref 79–97)
Platelets: 287 10*3/uL (ref 150–450)
RBC: 4.31 x10E6/uL (ref 3.77–5.28)
RDW: 13 % (ref 11.7–15.4)
WBC: 10.1 10*3/uL (ref 3.4–10.8)

## 2018-11-07 LAB — RPR: RPR Ser Ql: NONREACTIVE

## 2018-11-07 LAB — HIV ANTIBODY (ROUTINE TESTING W REFLEX): HIV Screen 4th Generation wRfx: NONREACTIVE

## 2018-11-07 NOTE — Telephone Encounter (Signed)
The patient called in to return a call. Informed the patient of the failed glucose test and an upcoming appointment with the diabetes education. The patient stated she has questions however will wait until the appointment to discuss concerns.

## 2018-11-07 NOTE — Telephone Encounter (Addendum)
-----   Message from Hermina Staggers, MD sent at 11/07/2018  8:31 AM EDT ----- Please let pt know that she failed her Glucola test and has GDM Refer for diabetic education Thanks Casimiro Needle  LM for pt to please call the office for results.

## 2018-11-08 NOTE — Telephone Encounter (Signed)
Interpreter [phone] used to contact pt regarding her glucola results.  Pt advised that she failed the glucose test and has GDM.  Pt has appointment for diabetic education scheduled for tomorrow 11/09/18 @ 0815.  Pt verbalized understanding.

## 2018-11-09 ENCOUNTER — Encounter: Payer: Medicaid Other | Attending: Obstetrics and Gynecology | Admitting: *Deleted

## 2018-11-09 ENCOUNTER — Other Ambulatory Visit: Payer: Self-pay

## 2018-11-09 ENCOUNTER — Ambulatory Visit: Payer: Medicaid Other | Admitting: *Deleted

## 2018-11-09 DIAGNOSIS — Z3A00 Weeks of gestation of pregnancy not specified: Secondary | ICD-10-CM | POA: Insufficient documentation

## 2018-11-09 DIAGNOSIS — Z713 Dietary counseling and surveillance: Secondary | ICD-10-CM | POA: Insufficient documentation

## 2018-11-09 DIAGNOSIS — O24410 Gestational diabetes mellitus in pregnancy, diet controlled: Secondary | ICD-10-CM

## 2018-11-09 DIAGNOSIS — O24419 Gestational diabetes mellitus in pregnancy, unspecified control: Secondary | ICD-10-CM | POA: Diagnosis not present

## 2018-11-09 MED ORDER — GLUCOSE BLOOD VI STRP: ORAL_STRIP | 12 refills | Status: DC

## 2018-11-09 MED ORDER — ACCU-CHEK GUIDE W/DEVICE KIT: 1.0000 | PACK | Freq: Once | 0 refills | Status: AC

## 2018-11-09 MED ORDER — ACCU-CHEK FASTCLIX LANCETS MISC: 1.0000 | Freq: Four times a day (QID) | 12 refills | Status: DC

## 2018-11-09 NOTE — Progress Notes (Signed)
Orders Only.

## 2018-11-09 NOTE — Progress Notes (Signed)
  Patient was seen on 11/09/2018 for Gestational Diabetes self-management. EDD 01/18/2019. Patient speaks Arabic, we used the [employer] interpretors for this visit. Lost signal, used 3 interpreters eventually. Patient states no history of GDM. Diet history obtained. Patient eats good variety of all food groups. She is completing her fasting for Ramadan in 2 days. Beverages include water and black coffee.  The following learning objectives were met by the patient :   States the definition of Gestational Diabetes  States why dietary management is important in controlling blood glucose  Describes the effects of carbohydrates on blood glucose levels  Demonstrates ability to create a balanced meal plan  Demonstrates carbohydrate counting   States when to check blood glucose levels  Demonstrates proper blood glucose monitoring techniques  States the effect of stress and exercise on blood glucose levels  States the importance of limiting caffeine and abstaining from alcohol and smoking  Plan:  Aim for 3 Carb Choices per meal (45 grams) +/- 1 either way  Aim for 1-2 Carbs per snack Begin reading food labels for Total Carbohydrate of foods If OK with your MD, consider  increasing your activity level by walking, Arm Chair Exercises or other activity daily as tolerated Begin checking BG before breakfast and 2 hours after first bite of breakfast, lunch and dinner as directed by MD  Bring Log Book/Sheet and meter to every medical appointment OR use Baby Scripts (see below) Baby Scripts:  Patient not appropriate for [employer] due to language barrier  Take medication if directed by MD  Blood glucose monitor Rx called into pharmacy: Accu Check Guide with Fast Clix drums Patient instructed to test pre breakfast and 2 hours each meal as directed by MD  Patient instructed to monitor glucose levels: FBS: 60 - 95 mg/dl 2 hour: <120 mg/dl  Patient received the following handouts:  Nutrition  Diabetes and Pregnancy  Carbohydrate Counting List  BG Log Sheet  Patient will be seen for follow-up as needed.

## 2018-11-20 ENCOUNTER — Telehealth: Payer: Self-pay | Admitting: Obstetrics & Gynecology

## 2018-11-20 ENCOUNTER — Other Ambulatory Visit: Payer: Self-pay

## 2018-11-20 ENCOUNTER — Encounter: Payer: Medicaid Other | Admitting: Obstetrics & Gynecology

## 2018-11-20 NOTE — Telephone Encounter (Signed)
Attempted to call patient to get her rescheduled for her missed OB appointment with Arabic interpreter ID [phone]. No answer, interpreter left voicemail with new virtual appointment 6/3 @ 9:35 and instructions for [employer] if the app is not already downloaded.

## 2018-11-20 NOTE — Progress Notes (Signed)
2:05 I called Sonya Turner with Interpreter and left message we are calling for your virtual visit and will call again in a few minutes - please be available by phone.  Bonita Quin, RN 2:44pm I called Sonya Turner with Interpreter again and left a message we are calling for your virtual visit and you will need to call to reschedule. Jennette Banker of Attending Supervision of RN: Evaluation and management procedures were performed by the nurse under my supervision and collaboration.  I have reviewed the nursing note and chart, and I agree with the management and plan.  Pt did not answer and was not seen.   Carolyn L. Harraway-Smith, M.D., [place]

## 2018-11-21 ENCOUNTER — Telehealth: Payer: Self-pay | Admitting: Obstetrics & Gynecology

## 2018-11-21 NOTE — Telephone Encounter (Signed)
Spoke with patient about having [employer]. She stated she had it.

## 2018-11-22 ENCOUNTER — Ambulatory Visit (INDEPENDENT_AMBULATORY_CARE_PROVIDER_SITE_OTHER): Payer: Medicaid Other | Admitting: Obstetrics & Gynecology

## 2018-11-22 ENCOUNTER — Other Ambulatory Visit: Payer: Self-pay

## 2018-11-22 VITALS — BP 117/72 | HR 79

## 2018-11-22 DIAGNOSIS — Z789 Other specified health status: Secondary | ICD-10-CM

## 2018-11-22 DIAGNOSIS — O99119 Other diseases of the blood and blood-forming organs and certain disorders involving the immune mechanism complicating pregnancy, unspecified trimester: Secondary | ICD-10-CM

## 2018-11-22 DIAGNOSIS — O24410 Gestational diabetes mellitus in pregnancy, diet controlled: Secondary | ICD-10-CM

## 2018-11-22 DIAGNOSIS — O99113 Other diseases of the blood and blood-forming organs and certain disorders involving the immune mechanism complicating pregnancy, third trimester: Secondary | ICD-10-CM

## 2018-11-22 DIAGNOSIS — Z3A31 31 weeks gestation of pregnancy: Secondary | ICD-10-CM

## 2018-11-22 DIAGNOSIS — O24419 Gestational diabetes mellitus in pregnancy, unspecified control: Secondary | ICD-10-CM | POA: Insufficient documentation

## 2018-11-22 DIAGNOSIS — O0990 Supervision of high risk pregnancy, unspecified, unspecified trimester: Secondary | ICD-10-CM

## 2018-11-22 DIAGNOSIS — D6851 Activated protein C resistance: Secondary | ICD-10-CM

## 2018-11-22 NOTE — Progress Notes (Signed)
   TELEHEALTH OBSTETRICS PRENATAL VIRTUAL VIDEO VISIT ENCOUNTER NOTE  Provider location: Center for [employer] at [facility]   I connected with Leonora Groseclose on 11/22/18 at  9:35 AM EDT by WebEx Encounter at home and verified that I am speaking with the correct person using two identifiers.   I discussed the limitations, risks, security and privacy concerns of performing an evaluation and management service by telephone and the availability of in person appointments. I also discussed with the patient that there may be a patient responsible charge related to this service. The patient expressed understanding and agreed to proceed. Subjective:  Jaquay Louck is a 33 y.o. L3J0300 at [redacted]w[redacted]d being seen today for ongoing prenatal care.  She is currently monitored for the following issues for this high-risk pregnancy and has Heterozygous factor V Leiden affecting pregnancy, antepartum (HCC); Language barrier; Four previous miscarriages, affecting care of mother in second trimester, antepartum; Chronic headaches; Refugee health examination; Supervision of high risk pregnancy, antepartum; and Abdominal pain affecting pregnancy on their problem list.  Patient reports no complaints.   .  .   . Denies any leaking of fluid.   The following portions of the patient's history were reviewed and updated as appropriate: allergies, current medications, past family history, past medical history, past social history, past surgical history and problem list.   Objective:  There were no vitals filed for this visit.  Fetal Status:           General:  Alert, oriented and cooperative. Patient is in no acute distress.  Respiratory: Normal respiratory effort, no problems with respiration noted  Mental Status: Normal mood and affect. Normal behavior. Normal judgment and thought content.  Rest of physical exam deferred due to type of encounter  Imaging: No results found.  Assessment and Plan:  Pregnancy: P2Z3007 at  [redacted]w[redacted]d 1. Supervision of high risk pregnancy, antepartum   2. Heterozygous factor V Leiden affecting pregnancy, antepartum (HCC) - on lovenox  3. Language barrier - [employer] interpretor used for visit  4. GDM- she is checking her sugars, most are within range - MFM u/s ordered  Preterm labor symptoms and general obstetric precautions including but not limited to vaginal bleeding, contractions, leaking of fluid and fetal movement were reviewed in detail with the patient. I discussed the assessment and treatment plan with the patient. The patient was provided an opportunity to ask questions and all were answered. The patient agreed with the plan and demonstrated an understanding of the instructions. The patient was advised to call back or seek an in-person office evaluation/go to MAU at [facility] for any urgent or concerning symptoms. Please refer to After Visit Summary for other counseling recommendations.   I provided 10 minutes of face-to-face time during this encounter.  No follow-ups on file.  Future Appointments  Date Time Provider Department Center  11/22/2018  9:35 AM Allie Bossier, MD [facility]    Allie Bossier, MD Center for [facility], [facility]

## 2018-11-22 NOTE — Progress Notes (Signed)
Interpreter Elmer used to contact pt.  I connected with  Sonya Turner on 11/22/18 at  9:35 AM EDT by telephone and verified that I am speaking with the correct person using two identifiers.   I discussed the limitations, risks, security and privacy concerns of performing an evaluation and management service by telephone and the availability of in person appointments. I also discussed with the patient that there may be a patient responsible charge related to this service. The patient expressed understanding and agreed to proceed.  [name], RN 11/22/2018  9:22 AM  Pt states she is checking blood sugars.  She states she has recorded the ranges below: Fasting:91-103 After Breakfast:110-148 After Lunch:105-118 After Dinner:115-164

## 2018-11-27 ENCOUNTER — Other Ambulatory Visit (HOSPITAL_COMMUNITY): Payer: Self-pay | Admitting: *Deleted

## 2018-11-27 ENCOUNTER — Other Ambulatory Visit: Payer: Medicaid Other

## 2018-11-27 ENCOUNTER — Other Ambulatory Visit: Payer: Self-pay

## 2018-11-27 ENCOUNTER — Encounter (HOSPITAL_COMMUNITY): Payer: Self-pay

## 2018-11-27 ENCOUNTER — Ambulatory Visit (HOSPITAL_COMMUNITY)
Admission: RE | Admit: 2018-11-27 | Discharge: 2018-11-27 | Disposition: A | Discharge status: Home / Self Care | Payer: Medicaid Other | Source: Ambulatory Visit | Attending: Obstetrics and Gynecology | Admitting: Obstetrics and Gynecology

## 2018-11-27 ENCOUNTER — Ambulatory Visit (HOSPITAL_COMMUNITY): Payer: Medicaid Other | Admitting: *Deleted

## 2018-11-27 VITALS — BP 107/66 | HR 106 | Temp 98.4°F

## 2018-11-27 DIAGNOSIS — O26899 Other specified pregnancy related conditions, unspecified trimester: Secondary | ICD-10-CM | POA: Insufficient documentation

## 2018-11-27 DIAGNOSIS — O24410 Gestational diabetes mellitus in pregnancy, diet controlled: Secondary | ICD-10-CM

## 2018-11-27 DIAGNOSIS — Z362 Encounter for other antenatal screening follow-up: Secondary | ICD-10-CM | POA: Diagnosis not present

## 2018-11-27 DIAGNOSIS — R109 Unspecified abdominal pain: Secondary | ICD-10-CM

## 2018-11-27 DIAGNOSIS — O99113 Other diseases of the blood and blood-forming organs and certain disorders involving the immune mechanism complicating pregnancy, third trimester: Secondary | ICD-10-CM

## 2018-11-27 DIAGNOSIS — O0990 Supervision of high risk pregnancy, unspecified, unspecified trimester: Secondary | ICD-10-CM | POA: Diagnosis not present

## 2018-11-27 DIAGNOSIS — Z3A32 32 weeks gestation of pregnancy: Secondary | ICD-10-CM | POA: Diagnosis not present

## 2018-11-28 LAB — HEMOGLOBIN A1C
Est. average glucose Bld gHb Est-mCnc: 103 mg/dL
Hgb A1c MFr Bld: 5.2 % (ref 4.8–5.6)

## 2018-12-06 ENCOUNTER — Telehealth: Payer: Self-pay | Admitting: Obstetrics & Gynecology

## 2018-12-06 NOTE — Telephone Encounter (Signed)
Spoke with husband.Patient was called and instructed about her visit for 12/07/2018.

## 2018-12-07 ENCOUNTER — Other Ambulatory Visit: Payer: Self-pay

## 2018-12-07 ENCOUNTER — Ambulatory Visit (INDEPENDENT_AMBULATORY_CARE_PROVIDER_SITE_OTHER): Payer: Medicaid Other | Admitting: Obstetrics & Gynecology

## 2018-12-07 DIAGNOSIS — Z3A33 33 weeks gestation of pregnancy: Secondary | ICD-10-CM

## 2018-12-07 DIAGNOSIS — O24410 Gestational diabetes mellitus in pregnancy, diet controlled: Secondary | ICD-10-CM | POA: Diagnosis not present

## 2018-12-07 MED ORDER — ACCU-CHEK GUIDE VI STRP: ORAL_STRIP | 12 refills | Status: DC

## 2018-12-07 MED ORDER — PANTOPRAZOLE SODIUM 20 MG PO TBEC: 20.0000 mg | DELAYED_RELEASE_TABLET | Freq: Every day | ORAL | 1 refills | Status: AC

## 2018-12-07 MED ORDER — METFORMIN HCL 500 MG PO TABS: 500.0000 mg | ORAL_TABLET | Freq: Every day | ORAL | 1 refills | Status: DC

## 2018-12-07 NOTE — Patient Instructions (Signed)
Blood Glucose Monitoring, Adult Monitoring your blood sugar (glucose) is an important part of managing your diabetes (diabetes mellitus). Blood glucose monitoring involves checking your blood glucose as often as directed and keeping a record (log) of your results over time. Checking your blood glucose regularly and keeping a blood glucose log can:  Help you and your health care provider adjust your diabetes management plan as needed, including your medicines or insulin.  Help you understand how food, exercise, illnesses, and medicines affect your blood glucose.  Let you know what your blood glucose is at any time. You can quickly find out if you have low blood glucose (hypoglycemia) or high blood glucose (hyperglycemia). Your health care provider will set individualized treatment goals for you. Your goals will be based on your age, other medical conditions you have, and how you respond to diabetes treatment. Generally, the goal of treatment is to maintain the following blood glucose levels:  Before meals (preprandial): 80-130 mg/dL (4.4-7.2 mmol/L).  After meals (postprandial): below 180 mg/dL (10 mmol/L).  A1c level: less than 7%. Supplies needed:  Blood glucose meter.  Test strips for your meter. Each meter has its own strips. You must use the strips that came with your meter.  A needle to prick your finger (lancet). Do not use a lancet more than one time.  A device that holds the lancet (lancing device).  A journal or log book to write down your results. How to check your blood glucose  1. Wash your hands with soap and water. 2. Prick the side of your finger (not the tip) with the lancet. Use a different finger each time. 3. Gently rub the finger until a small drop of blood appears. 4. Follow instructions that come with your meter for inserting the test strip, applying blood to the strip, and using your blood glucose meter. 5. Write down your result and any notes. Some meters  allow you to use areas of your body other than your finger (alternative sites) to test your blood. The most common alternative sites are:  Forearm.  Thigh.  Palm of the hand. If you think you may have hypoglycemia, or if you have a history of not knowing when your blood glucose is getting low (hypoglycemia unawareness), do not use alternative sites. Use your finger instead. Alternative sites may not be as accurate as the fingers, because blood flow is slower in these areas. This means that the result you get may be delayed, and it may be different from the result that you would get from your finger. Follow these instructions at home: Blood glucose log   Every time you check your blood glucose, write down your result. Also write down any notes about things that may be affecting your blood glucose, such as your diet and exercise for the day. This information can help you and your health care provider: ? Look for patterns in your blood glucose over time. ? Adjust your diabetes management plan as needed.  Check if your meter allows you to download your records to a computer. Most glucose meters store a record of glucose readings in the meter. If you have type 1 diabetes:  Check your blood glucose 2 or more times a day.  Also check your blood glucose: ? Before every insulin injection. ? Before and after exercise. ? Before meals. ? 2 hours after a meal. ? Occasionally between 2:00 a.m. and 3:00 a.m., as directed. ? Before potentially dangerous tasks, like driving or using heavy machinery. ?   At bedtime.  You may need to check your blood glucose more often, up to 6-10 times a day, if you: ? Use an insulin pump. ? Need multiple daily injections (MDI). ? Have diabetes that is not well-controlled. ? Are ill. ? Have a history of severe hypoglycemia. ? Have hypoglycemia unawareness. If you have type 2 diabetes:  If you take insulin or other diabetes medicines, check your blood glucose 2 or  more times a day.  If you are on intensive insulin therapy, check your blood glucose 4 or more times a day. Occasionally, you may also need to check between 2:00 a.m. and 3:00 a.m., as directed.  Also check your blood glucose: ? Before and after exercise. ? Before potentially dangerous tasks, like driving or using heavy machinery.  You may need to check your blood glucose more often if: ? Your medicine is being adjusted. ? Your diabetes is not well-controlled. ? You are ill. General tips  Always keep your supplies with you.  If you have questions or need help, all blood glucose meters have a 24-hour "hotline" phone number that you can call. You may also contact your health care provider.  After you use a few boxes of test strips, adjust (calibrate) your blood glucose meter by following instructions that came with your meter. Contact a health care provider if:  Your blood glucose is at or above 240 mg/dL (13.3 mmol/L) for 2 days in a row.  You have been sick or have had a fever for 2 days or longer, and you are not getting better.  You have any of the following problems for more than 6 hours: ? You cannot eat or drink. ? You have nausea or vomiting. ? You have diarrhea. Get help right away if:  Your blood glucose is lower than 54 mg/dL (3 mmol/L).  You become confused or you have trouble thinking clearly.  You have difficulty breathing.  You have moderate or large ketone levels in your urine. Summary  Monitoring your blood sugar (glucose) is an important part of managing your diabetes (diabetes mellitus).  Blood glucose monitoring involves checking your blood glucose as often as directed and keeping a record (log) of your results over time.  Your health care provider will set individualized treatment goals for you. Your goals will be based on your age, other medical conditions you have, and how you respond to diabetes treatment.  Every time you check your blood glucose,  write down your result. Also write down any notes about things that may be affecting your blood glucose, such as your diet and exercise for the day. This information is not intended to replace advice given to you by your health care provider. Make sure you discuss any questions you have with your health care provider. Document Released: 06/10/2003 Document Revised: 04/18/2017 Document Reviewed: 11/17/2015 Elsevier Interactive Patient Education  2019 Elsevier Inc.  

## 2018-12-07 NOTE — Progress Notes (Signed)
Pt. writes Glucose readings on Log

## 2018-12-07 NOTE — Progress Notes (Signed)
   TELEHEALTH VIRTUAL OBSTETRICS VISIT ENCOUNTER NOTE  I connected with Sonya Turner on 12/07/18 at  8:15 AM EDT by telephone at home and verified that I am speaking with the correct person using two identifiers.   I discussed the limitations, risks, security and privacy concerns of performing an evaluation and management service by telephone and the availability of in person appointments. I also discussed with the patient that there may be a patient responsible charge related to this service. The patient expressed understanding and agreed to proceed.  Subjective:  Sonya Turner is a 33 y.o. W4X3244 at [redacted]w[redacted]d being followed for ongoing prenatal care.  She is currently monitored for the following issues for this high-risk pregnancy and has Heterozygous factor V Leiden affecting pregnancy, antepartum ([place]); Language barrier; Four previous miscarriages, affecting care of mother in second trimester, antepartum; Chronic headaches; Refugee health examination; Supervision of high risk pregnancy, antepartum; Abdominal pain affecting pregnancy; and GDM (gestational diabetes mellitus) on their problem list.  Patient reports heartburn. Reports fetal movement. Denies any contractions, bleeding or leaking of fluid.   The following portions of the patient's history were reviewed and updated as appropriate: allergies, current medications, past family history, past medical history, past social history, past surgical history and problem list.   Objective:   General:  Alert, oriented and cooperative.   Mental Status: Normal mood and affect perceived. Normal judgment and thought content.  Rest of physical exam deferred due to type of encounter  Assessment and Plan:  Pregnancy: W1U2725 at [redacted]w[redacted]d 1. Diet controlled gestational diabetes mellitus (GDM) in third trimester FBS most >100, PP 120-130. Will add medication  - metFORMIN (GLUCOPHAGE) 500 MG tablet; Take 1 tablet (500 mg total) by mouth daily with supper.   Dispense: 30 tablet; Refill: 1 - glucose blood (ACCU-CHEK GUIDE) test strip; Use as instructed QID  Dispense: 100 each; Refill: 12 - pantoprazole (PROTONIX) 20 MG tablet; Take 1 tablet (20 mg total) by mouth daily.  Dispense: 30 tablet; Refill: 1  Preterm labor symptoms and general obstetric precautions including but not limited to vaginal bleeding, contractions, leaking of fluid and fetal movement were reviewed in detail with the patient.  I discussed the assessment and treatment plan with the patient. The patient was provided an opportunity to ask questions and all were answered. The patient agreed with the plan and demonstrated an understanding of the instructions. The patient was advised to call back or seek an in-person office evaluation/go to [facility] at [facility] for any urgent or concerning symptoms. Please refer to After Visit Summary for other counseling recommendations.   I provided 15 minutes of non-face-to-face time during this encounter. Phone visit, assisted by [facility] interpreter Arabic  Return in about 1 week (around 12/14/2018) for Tdap and BPP NST for diabetes, provider.  Future Appointments  Date Time Provider [place]  12/26/2018  1:30 PM [place] [facility]  12/26/2018  1:30 PM [facility] [place] 5 [facility]    Emeterio Reeve, [place] for [place], [place]

## 2018-12-12 ENCOUNTER — Telehealth: Payer: Self-pay | Admitting: Family Medicine

## 2018-12-12 NOTE — Telephone Encounter (Signed)
Patient called to see what her appointment was for. With the help of interpreter [phone], I was able to give her the information needed for her visit. No visitors, instructed about wearing a mask for their entire visit, and she was screened for COVID-19 symptoms and denied any symptoms. Instructed to use the provided hand sanitizer when entering building.

## 2018-12-14 ENCOUNTER — Ambulatory Visit (INDEPENDENT_AMBULATORY_CARE_PROVIDER_SITE_OTHER): Payer: Medicaid Other | Admitting: Obstetrics & Gynecology

## 2018-12-14 ENCOUNTER — Ambulatory Visit: Payer: Self-pay

## 2018-12-14 ENCOUNTER — Ambulatory Visit (INDEPENDENT_AMBULATORY_CARE_PROVIDER_SITE_OTHER): Payer: Medicaid Other | Admitting: General Practice

## 2018-12-14 ENCOUNTER — Other Ambulatory Visit: Payer: Self-pay

## 2018-12-14 VITALS — BP 107/69 | HR 102 | Temp 98.0°F | Wt 160.8 lb

## 2018-12-14 DIAGNOSIS — D6851 Activated protein C resistance: Secondary | ICD-10-CM

## 2018-12-14 DIAGNOSIS — Z3A34 34 weeks gestation of pregnancy: Secondary | ICD-10-CM

## 2018-12-14 DIAGNOSIS — O24410 Gestational diabetes mellitus in pregnancy, diet controlled: Secondary | ICD-10-CM

## 2018-12-14 DIAGNOSIS — O24415 Gestational diabetes mellitus in pregnancy, controlled by oral hypoglycemic drugs: Secondary | ICD-10-CM

## 2018-12-14 DIAGNOSIS — O99113 Other diseases of the blood and blood-forming organs and certain disorders involving the immune mechanism complicating pregnancy, third trimester: Secondary | ICD-10-CM

## 2018-12-14 DIAGNOSIS — O0990 Supervision of high risk pregnancy, unspecified, unspecified trimester: Secondary | ICD-10-CM

## 2018-12-14 DIAGNOSIS — O0993 Supervision of high risk pregnancy, unspecified, third trimester: Secondary | ICD-10-CM

## 2018-12-14 MED ORDER — METFORMIN HCL 1000 MG PO TABS: 1000.0000 mg | ORAL_TABLET | Freq: Every day | ORAL | 1 refills | Status: DC

## 2018-12-14 NOTE — Progress Notes (Signed)
PRENATAL VISIT NOTE  Subjective:  Sonya Turner is a 33 y.o. [id number] at [redacted]w[redacted]d being seen today for ongoing prenatal care. Due to language barrier, an Arabic interpreter was present during the history-taking and subsequent discussion (and for part of the physical exam) with this patient.  She is currently monitored for the following issues for this high-risk pregnancy and has Heterozygous factor V Leiden affecting pregnancy, antepartum ([place]); Language barrier; Four previous miscarriages, affecting care of mother in second trimester, antepartum; Chronic headaches; Supervision of high risk pregnancy, antepartum; and GDM (gestational diabetes mellitus) on their problem list.  Patient reports no complaints.  Contractions: Not present. Vag. Bleeding: None.  Movement: Present. Denies leaking of fluid.   The following portions of the patient's history were reviewed and updated as appropriate: allergies, current medications, past family history, past medical history, past social history, past surgical history and problem list.   Objective:   Vitals:   12/14/18 1442  BP: 107/69  Pulse: (!) 102  Temp: 98 F (36.7 C)  Weight: 160 lb 12.8 oz (72.9 kg)    Fetal Status: Fetal Heart Rate (bpm): 144   Movement: Present     General:  Alert, oriented and cooperative. Patient is in no acute distress.  Skin: Skin is warm and dry. No rash noted.   Cardiovascular: Normal heart rate noted  Respiratory: Normal respiratory effort, no problems with respiration noted  Abdomen: Soft, gravid, appropriate for gestational age.  Pain/Pressure: Absent     Pelvic: Cervical exam deferred        Extremities: Normal range of motion.  Edema: Trace  Mental Status: Normal mood and affect. Normal behavior. Normal judgment and thought content.   Assessment and Plan:  Pregnancy: [id number] at [redacted]w[redacted]d 1. Gestational diabetes mellitus (GDM) in third trimester controlled on oral hypoglycemic drug Elevated blood sugars aafter dinner  (as high as 180s) and low 100s fasting. Normal other postprandials. Will increase Metformin at night to 1000 mg.  NST performed today was reviewed and was found to be reactive. Subsequent BPP performed today was also reviewed and was found to be 8/10 (no breathing). AFI was also normal. Continue recommended antenatal testing and growth scan, next one on 12/26/18. - metFORMIN (GLUCOPHAGE) 1000 MG tablet; Take 1 tablet (1,000 mg total) by mouth daily with supper.  Dispense: 30 tablet; Refill: 1  2. Heterozygous factor V Leiden affecting pregnancy, antepartum (HCC) Continue Lovenox  3. Supervision of high risk pregnancy, antepartum Preterm labor symptoms and general obstetric precautions including but not limited to vaginal bleeding, contractions, leaking of fluid and fetal movement were reviewed in detail with the patient. Please refer to After Visit Summary for other counseling recommendations.   Return in about 1 week (around 12/21/2018) for NST, BPP, OB Visit ([place]).  Future Appointments  Date Time Provider [place]  12/21/2018  1:15 PM WOC-WOCA NST WOC-WOCA WOC  12/26/2018  1:30 PM [place] MFC-US  12/26/2018  1:30 PM [place] 5 WH-MFCUS MFC-US  12/28/2018  1:15 PM WOC-WOCA NST WOC-WOCA WOC  12/28/2018  2:15 PM Nicolette Bang, DO [place]  01/04/2019  1:15 PM WOC-WOCA NST WOC-WOCA WOC  01/04/2019  2:15 PM Wilmot Quevedo, Sallyanne Havers, MD WOC-WOCA WOC  01/11/2019  1:15 PM WOC-WOCA NST WOC-WOCA WOC  01/11/2019  2:15 PM Emily Filbert, MD [place] WOC  01/18/2019  1:15 PM WOC-WOCA NST WOC-WOCA WOC  01/18/2019  2:15 PM Donnamae Jude, MD [place]  01/25/2019  1:15 PM WOC-WOCA NST WOC-WOCA  WOC  01/25/2019  2:15 PM Conan Bowensavis, Kelly M, MD WOC-WOCA WOC    Jaynie CollinsUgonna Domique Clapper, MD

## 2018-12-14 NOTE — Patient Instructions (Signed)
Return to office for any scheduled appointments. Call the office or go to the MAU at [facility] if:  You begin to have strong, frequent contractions  Your water breaks.  Sometimes it is a big gush of fluid, sometimes it is just a trickle that keeps getting your panties wet or running down your legs  You have vaginal bleeding.  It is normal to have a small amount of spotting if your cervix was checked.   You do not feel your baby moving like normal.  If you do not, get something to eat and drink and lay down and focus on feeling your baby move.   If your baby is still not moving like normal, you should call the office or go to MAU.  Any other obstetric concerns.   

## 2018-12-14 NOTE — Progress Notes (Signed)
Pt informed that the ultrasound is considered a limited OB ultrasound and is not intended to be a complete ultrasound exam.  Patient also informed that the ultrasound is not being completed with the intent of assessing for fetal or placental anomalies or any pelvic abnormalities.  Explained that the purpose of today's ultrasound is to assess for  BPP, presentation and AFI.  Patient acknowledges the purpose of the exam and the limitations of the study.    Carrie H RN BSN 12/14/18  

## 2018-12-20 ENCOUNTER — Other Ambulatory Visit: Payer: Self-pay | Admitting: Obstetrics & Gynecology

## 2018-12-21 ENCOUNTER — Ambulatory Visit (INDEPENDENT_AMBULATORY_CARE_PROVIDER_SITE_OTHER): Payer: Medicaid Other | Admitting: *Deleted

## 2018-12-21 ENCOUNTER — Ambulatory Visit: Payer: Self-pay

## 2018-12-21 ENCOUNTER — Ambulatory Visit (INDEPENDENT_AMBULATORY_CARE_PROVIDER_SITE_OTHER): Payer: Medicaid Other | Admitting: Obstetrics and Gynecology

## 2018-12-21 ENCOUNTER — Other Ambulatory Visit: Payer: Self-pay

## 2018-12-21 VITALS — BP 105/62 | HR 107 | Wt 158.2 lb

## 2018-12-21 DIAGNOSIS — O99113 Other diseases of the blood and blood-forming organs and certain disorders involving the immune mechanism complicating pregnancy, third trimester: Secondary | ICD-10-CM

## 2018-12-21 DIAGNOSIS — O24415 Gestational diabetes mellitus in pregnancy, controlled by oral hypoglycemic drugs: Secondary | ICD-10-CM

## 2018-12-21 DIAGNOSIS — O0990 Supervision of high risk pregnancy, unspecified, unspecified trimester: Secondary | ICD-10-CM

## 2018-12-21 DIAGNOSIS — Z3A35 35 weeks gestation of pregnancy: Secondary | ICD-10-CM

## 2018-12-21 DIAGNOSIS — O0993 Supervision of high risk pregnancy, unspecified, third trimester: Secondary | ICD-10-CM

## 2018-12-21 DIAGNOSIS — D6851 Activated protein C resistance: Secondary | ICD-10-CM

## 2018-12-21 MED ORDER — ENOXAPARIN SODIUM 40 MG/0.4ML ~~LOC~~ SOLN: 40.0000 mg | Freq: Every day | SUBCUTANEOUS | 2 refills | Status: AC

## 2018-12-21 NOTE — Progress Notes (Signed)
Subjective:  Sonya Turner is a 33 y.o. [id number] at [redacted]w[redacted]d being seen today for ongoing prenatal care.  She is currently monitored for the following issues for this high-risk pregnancy and has Heterozygous factor V Leiden affecting pregnancy, antepartum ([place]); Language barrier; Four previous miscarriages, affecting care of mother in second trimester, antepartum; Chronic headaches; Supervision of high risk pregnancy, antepartum; and GDM (gestational diabetes mellitus) on their problem list.  Patient reports general discomforts of pregnancy.  Contractions: Not present. Vag. Bleeding: None.  Movement: Present. Denies leaking of fluid.   The following portions of the patient's history were reviewed and updated as appropriate: allergies, current medications, past family history, past medical history, past social history, past surgical history and problem list. Problem list updated.  Objective:   Vitals:   12/21/18 1333  BP: 105/62  Pulse: (!) 107  Weight: 158 lb 3.2 oz (71.8 kg)    Fetal Status: Fetal Heart Rate (bpm): NST   Movement: Present     General:  Alert, oriented and cooperative. Patient is in no acute distress.  Skin: Skin is warm and dry. No rash noted.   Cardiovascular: Normal heart rate noted  Respiratory: Normal respiratory effort, no problems with respiration noted  Abdomen: Soft, gravid, appropriate for gestational age. Pain/Pressure: Present     Pelvic:  Cervical exam deferred        Extremities: Normal range of motion.     Mental Status: Normal mood and affect. Normal behavior. Normal judgment and thought content.   Urinalysis:      Assessment and Plan:  Pregnancy: [id number] at [redacted]w[redacted]d  1. Supervision of high risk pregnancy, antepartum Stable GBS and vaginal cultures with next visit 2. Gestational diabetes mellitus (GDM) in third trimester controlled on oral hypoglycemic drug CBG in goal range Continue with Metformin BPP 10/10 Continue with antenatal testing  3.  Heterozygous factor V Leiden affecting pregnancy, antepartum (HCC) Stable - enoxaparin (LOVENOX) 40 MG/0.4ML injection; Inject 0.4 mLs (40 mg total) into the skin daily. 40 ml prefilled syringes 1 qd  Dispense: 0.4 mL; Refill: 2  Preterm labor symptoms and general obstetric precautions including but not limited to vaginal bleeding, contractions, leaking of fluid and fetal movement were reviewed in detail with the patient. Please refer to After Visit Summary for other counseling recommendations.  No follow-ups on file.   Chancy Milroy, MD

## 2018-12-21 NOTE — Progress Notes (Signed)
Arabic interpreter [place] present for encounter.  Pt is taking Metformin once daily however not @ consistent times. She seems to be confused - takes sometimes in the evening, sometimes @ 1-3 am. I advised pt that she should take daily with the evening meal which she states is @ 7 pm. Pt voiced understanding. [place] for growth scheduled on 7/7, BPP added.   Pt informed that the ultrasound is considered a limited OB ultrasound and is not intended to be a complete ultrasound exam.  Patient also informed that the ultrasound is not being completed with the intent of assessing for fetal or placental anomalies or any pelvic abnormalities.  Explained that the purpose of today's ultrasound is to assess for presentation, BPP and amniotic fluid volume  Patient acknowledges the purpose of the exam and the limitations of the study.

## 2018-12-26 ENCOUNTER — Ambulatory Visit (HOSPITAL_COMMUNITY): Payer: Medicaid Other | Admitting: *Deleted

## 2018-12-26 ENCOUNTER — Other Ambulatory Visit: Payer: Self-pay

## 2018-12-26 ENCOUNTER — Ambulatory Visit (HOSPITAL_COMMUNITY)
Admission: RE | Admit: 2018-12-26 | Discharge: 2018-12-26 | Disposition: A | Discharge status: Home / Self Care | Payer: Medicaid Other | Source: Ambulatory Visit | Attending: Obstetrics and Gynecology | Admitting: Obstetrics and Gynecology

## 2018-12-26 DIAGNOSIS — Z362 Encounter for other antenatal screening follow-up: Secondary | ICD-10-CM | POA: Diagnosis not present

## 2018-12-26 DIAGNOSIS — O24415 Gestational diabetes mellitus in pregnancy, controlled by oral hypoglycemic drugs: Secondary | ICD-10-CM | POA: Insufficient documentation

## 2018-12-26 DIAGNOSIS — Z3A36 36 weeks gestation of pregnancy: Secondary | ICD-10-CM

## 2018-12-26 DIAGNOSIS — O99113 Other diseases of the blood and blood-forming organs and certain disorders involving the immune mechanism complicating pregnancy, third trimester: Secondary | ICD-10-CM

## 2018-12-26 DIAGNOSIS — O24414 Gestational diabetes mellitus in pregnancy, insulin controlled: Secondary | ICD-10-CM

## 2018-12-26 DIAGNOSIS — O0990 Supervision of high risk pregnancy, unspecified, unspecified trimester: Secondary | ICD-10-CM

## 2018-12-26 DIAGNOSIS — O24410 Gestational diabetes mellitus in pregnancy, diet controlled: Secondary | ICD-10-CM | POA: Diagnosis not present

## 2018-12-27 ENCOUNTER — Telehealth: Payer: Self-pay | Admitting: Obstetrics & Gynecology

## 2018-12-27 NOTE — Telephone Encounter (Signed)
Called the patient to inform of upcoming appointment. Left a detailed voicemail of the location, appointment time and date. Also informed the patient if Ellison Hughs been in close contact with someone or diagnosed with Covid19 in the previous 14-day period please call to reschedule. If the patient has also experience flu-like symptoms such as sore throat, fever, and/or shortness of breath. Please call our office to reschedule. In addition, please be sure to wear a face mask to the visit and sanitize hands upon entering the office. Also, no children or visitors or allowed due to [place] restrictions. If you have any questions or concerns, please contact our office.  Interpreter ID [phone]

## 2018-12-28 ENCOUNTER — Other Ambulatory Visit (HOSPITAL_COMMUNITY)
Admission: RE | Admit: 2018-12-28 | Discharge: 2018-12-28 | Disposition: A | Discharge status: Home / Self Care | Payer: Medicaid Other | Source: Ambulatory Visit | Attending: Internal Medicine | Admitting: Internal Medicine

## 2018-12-28 ENCOUNTER — Other Ambulatory Visit: Payer: Self-pay

## 2018-12-28 ENCOUNTER — Ambulatory Visit (INDEPENDENT_AMBULATORY_CARE_PROVIDER_SITE_OTHER): Payer: Medicaid Other | Admitting: Internal Medicine

## 2018-12-28 ENCOUNTER — Other Ambulatory Visit: Payer: Medicaid Other

## 2018-12-28 VITALS — BP 106/61 | HR 107 | Wt 159.0 lb

## 2018-12-28 DIAGNOSIS — O99113 Other diseases of the blood and blood-forming organs and certain disorders involving the immune mechanism complicating pregnancy, third trimester: Secondary | ICD-10-CM | POA: Diagnosis not present

## 2018-12-28 DIAGNOSIS — O0993 Supervision of high risk pregnancy, unspecified, third trimester: Secondary | ICD-10-CM

## 2018-12-28 DIAGNOSIS — O24415 Gestational diabetes mellitus in pregnancy, controlled by oral hypoglycemic drugs: Secondary | ICD-10-CM

## 2018-12-28 DIAGNOSIS — O0990 Supervision of high risk pregnancy, unspecified, unspecified trimester: Secondary | ICD-10-CM | POA: Insufficient documentation

## 2018-12-28 DIAGNOSIS — Z23 Encounter for immunization: Secondary | ICD-10-CM

## 2018-12-28 DIAGNOSIS — D6851 Activated protein C resistance: Secondary | ICD-10-CM

## 2018-12-28 DIAGNOSIS — O99119 Other diseases of the blood and blood-forming organs and certain disorders involving the immune mechanism complicating pregnancy, unspecified trimester: Secondary | ICD-10-CM

## 2018-12-28 DIAGNOSIS — Z3A36 36 weeks gestation of pregnancy: Secondary | ICD-10-CM

## 2018-12-28 NOTE — Progress Notes (Signed)
   PRENATAL VISIT NOTE  Subjective:  Sonya Turner is a 33 y.o. [id number] at [redacted]w[redacted]d being seen today for ongoing prenatal care.  She is currently monitored for the following issues for this high-risk pregnancy and has Heterozygous factor V Leiden affecting pregnancy, antepartum ([place]); Language barrier; Four previous miscarriages, affecting care of mother in second trimester, antepartum; Chronic headaches; Supervision of high risk pregnancy, antepartum; and GDM (gestational diabetes mellitus) on their problem list.  Patient reports no complaints.  Contractions: Not present. Vag. Bleeding: None.  Movement: Present. Denies leaking of fluid.   The following portions of the patient's history were reviewed and updated as appropriate: allergies, current medications, past family history, past medical history, past social history, past surgical history and problem list.   Objective:   Vitals:   12/28/18 1437  BP: 106/61  Pulse: (!) 107  Weight: 159 lb (72.1 kg)    Fetal Status: Fetal Heart Rate (bpm): 166 Fundal Height: 36 cm Movement: Present  Presentation: Vertex  General:  Alert, oriented and cooperative. Patient is in no acute distress.  Skin: Skin is warm and dry. No rash noted.   Cardiovascular: Normal heart rate noted  Respiratory: Normal respiratory effort, no problems with respiration noted  Abdomen: Soft, gravid, appropriate for gestational age.  Pain/Pressure: Present     Pelvic: Cervical exam deferred Dilation: 1 Effacement (%): Thick Station: Ballotable  Extremities: Normal range of motion.     Mental Status: Normal mood and affect. Normal behavior. Normal judgment and thought content.   Assessment and Plan:  Pregnancy: [id number] at [redacted]w[redacted]d 1. Supervision of high risk pregnancy, antepartum - GC/Chlamydia probe amp ([place])not at [facility] - Culture, beta strep (group b only) - Tdap vaccine greater than or equal to 7yo IM  2. Gestational diabetes mellitus (GDM) in third trimester  controlled on oral hypoglycemic drug Patient takes Metformin 1000 mg BID and reports compliance. She does not have a CBG log but reports her fasting CBGs are 85-95. 2 hr Postprandial CBGs are 115-120. Will not adjust medications. Continue weekly testing. Will need IOL scheduled at 39 weeks.   3. Heterozygous factor V Leiden affecting pregnancy, antepartum (HCC) Continue Lovenox.   Preterm labor symptoms and general obstetric precautions including but not limited to vaginal bleeding, contractions, leaking of fluid and fetal movement were reviewed in detail with the patient. Please refer to After Visit Summary for other counseling recommendations.   No follow-ups on file.  Future Appointments  Date Time Provider [place]  01/04/2019  1:15 PM WOC-WOCA NST WOC-WOCA WOC  01/04/2019  2:15 PM Anyanwu, Sallyanne Havers, MD WOC-WOCA WOC  01/11/2019  1:15 PM WOC-WOCA NST WOC-WOCA WOC  01/11/2019  2:15 PM Emily Filbert, MD WOC-WOCA [place]  01/18/2019  1:15 PM WOC-WOCA NST WOC-WOCA WOC  01/18/2019  2:15 PM Donnamae Jude, MD WOC-WOCA [place]  01/25/2019  1:15 PM WOC-WOCA NST WOC-WOCA WOC  01/25/2019  2:15 PM Sloan Leiter, MD [place], DO

## 2018-12-29 LAB — GC/CHLAMYDIA PROBE AMP (~~LOC~~) NOT AT ARMC
Chlamydia: NEGATIVE
Neisseria Gonorrhea: NEGATIVE

## 2019-01-02 LAB — CULTURE, BETA STREP (GROUP B ONLY): Strep Gp B Culture: NEGATIVE

## 2019-01-04 ENCOUNTER — Other Ambulatory Visit: Payer: Self-pay

## 2019-01-04 ENCOUNTER — Ambulatory Visit (INDEPENDENT_AMBULATORY_CARE_PROVIDER_SITE_OTHER): Payer: Medicaid Other | Admitting: *Deleted

## 2019-01-04 ENCOUNTER — Encounter: Payer: Self-pay | Admitting: *Deleted

## 2019-01-04 ENCOUNTER — Encounter: Payer: Self-pay | Admitting: Obstetrics & Gynecology

## 2019-01-04 ENCOUNTER — Ambulatory Visit: Payer: Self-pay

## 2019-01-04 ENCOUNTER — Ambulatory Visit (INDEPENDENT_AMBULATORY_CARE_PROVIDER_SITE_OTHER): Payer: Medicaid Other | Admitting: Obstetrics & Gynecology

## 2019-01-04 VITALS — BP 102/65 | HR 115 | Wt 160.5 lb

## 2019-01-04 DIAGNOSIS — O0993 Supervision of high risk pregnancy, unspecified, third trimester: Secondary | ICD-10-CM

## 2019-01-04 DIAGNOSIS — D6851 Activated protein C resistance: Secondary | ICD-10-CM

## 2019-01-04 DIAGNOSIS — O24415 Gestational diabetes mellitus in pregnancy, controlled by oral hypoglycemic drugs: Secondary | ICD-10-CM

## 2019-01-04 DIAGNOSIS — Z3A37 37 weeks gestation of pregnancy: Secondary | ICD-10-CM

## 2019-01-04 DIAGNOSIS — O99113 Other diseases of the blood and blood-forming organs and certain disorders involving the immune mechanism complicating pregnancy, third trimester: Secondary | ICD-10-CM

## 2019-01-04 DIAGNOSIS — O0990 Supervision of high risk pregnancy, unspecified, unspecified trimester: Secondary | ICD-10-CM

## 2019-01-04 NOTE — Progress Notes (Signed)
   PRENATAL VISIT NOTE  Subjective:  Sonya Turner is a 33 y.o. [id number] at [redacted]w[redacted]d being seen today for ongoing prenatal care. Due to language barrier, an Arabic interpreter was present during the history-taking and subsequent discussion (and for part of the physical exam) with this patient. She is currently monitored for the following issues for this high-risk pregnancy and has Heterozygous factor V Leiden affecting pregnancy, antepartum ([place]); Language barrier; Four previous miscarriages, affecting care of mother in second trimester, antepartum; Chronic headaches; Supervision of high risk pregnancy, antepartum; and GDM (gestational diabetes mellitus) on their problem list.  Patient reports "feeling heavy".  Contractions: Irregular. Vag. Bleeding: None, Scant.  Movement: Present. Denies leaking of fluid.   The following portions of the patient's history were reviewed and updated as appropriate: allergies, current medications, past family history, past medical history, past social history, past surgical history and problem list.   Objective:   Vitals:   01/04/19 1344  BP: 102/65  Pulse: (!) 115  Weight: 160 lb 8 oz (72.8 kg)    Fetal Status: Fetal Heart Rate (bpm): NST   Movement: Present     General:  Alert, oriented and cooperative. Patient is in no acute distress.  Skin: Skin is warm and dry. No rash noted.   Cardiovascular: Normal heart rate noted  Respiratory: Normal respiratory effort, no problems with respiration noted  Abdomen: Soft, gravid, appropriate for gestational age.  Pain/Pressure: Present     Pelvic: Cervical exam deferred        Extremities: Normal range of motion.     Mental Status: Normal mood and affect. Normal behavior. Normal judgment and thought content.   Assessment and Plan:  Pregnancy: J4H7026 at [redacted]w[redacted]d 1. Gestational diabetes mellitus (GDM) in third trimester controlled on oral hypoglycemic drug CBGs are mostly within range, continue Metformin.  NST performed  today was reviewed and was found to be reactive. Subsequent BPP performed today was also reviewed and was found to be 8/10 (-2 for breathing). AFI was also normal. Cephalic presentation. Continue recommended antenatal testing and prenatal care. IOL scheduled at 39 weeks; 01/13/19 at 0700.  2. Heterozygous factor V Leiden affecting pregnancy, antepartum ([place]) On Lovenox, patient instructed not to take any doses after 12/12/18 PM dose given date and time of IOL.  3. Supervision of high risk pregnancy, antepartum Term labor symptoms and general obstetric precautions including but not limited to vaginal bleeding, contractions, leaking of fluid and fetal movement were reviewed in detail with the patient. Please refer to After Visit Summary for other counseling recommendations.   Return in about 1 week (around 01/11/2019) for as scheduled.  Future Appointments  Date Time Provider [place]  01/10/2019 10:30 AM MC-MAU 1 MC-INDC None  01/11/2019  1:15 PM WOC-WOCA NST WOC-WOCA WOC  01/11/2019  2:15 PM Emily Filbert, MD WOC-WOCA WOC  01/13/2019  7:00 AM MC-LD SCHED ROOM MC-INDC None    Verita Schneiders, MD

## 2019-01-04 NOTE — Patient Instructions (Signed)
Return to office for any scheduled appointments. Call the office or go to the MAU at [facility] [place] if:  You begin to have strong, frequent contractions  Your water breaks.  Sometimes it is a big gush of fluid, sometimes it is just a trickle that keeps getting your panties wet or running down your legs  You have vaginal bleeding.  It is normal to have a small amount of spotting if your cervix was checked.   You do not feel your baby moving like normal.  If you do not, get something to eat and drink and lay down and focus on feeling your baby move.   If your baby is still not moving like normal, you should call the office or go to MAU.  Any other obstetric concerns.   

## 2019-01-04 NOTE — Progress Notes (Signed)
Interpreter Sara present for encounter.   Pt informed that the ultrasound is considered a limited OB ultrasound and is not intended to be a complete ultrasound exam.  Patient also informed that the ultrasound is not being completed with the intent of assessing for fetal or placental anomalies or any pelvic abnormalities.  Explained that the purpose of today's ultrasound is to assess for presentation, BPP and amniotic fluid volume.  Patient acknowledges the purpose of the exam and the limitations of the study.    

## 2019-01-05 ENCOUNTER — Encounter (HOSPITAL_COMMUNITY): Payer: Self-pay | Admitting: *Deleted

## 2019-01-05 ENCOUNTER — Telehealth (HOSPITAL_COMMUNITY): Payer: Self-pay | Admitting: *Deleted

## 2019-01-05 NOTE — Telephone Encounter (Signed)
Preadmission screen Interpreter number [phone]

## 2019-01-08 ENCOUNTER — Other Ambulatory Visit: Payer: Self-pay | Admitting: Advanced Practice Midwife

## 2019-01-10 ENCOUNTER — Other Ambulatory Visit: Payer: Self-pay

## 2019-01-10 ENCOUNTER — Other Ambulatory Visit (HOSPITAL_COMMUNITY)
Admission: RE | Admit: 2019-01-10 | Discharge: 2019-01-10 | Disposition: A | Discharge status: Home / Self Care | Payer: Medicaid Other | Source: Ambulatory Visit | Attending: Obstetrics & Gynecology | Admitting: Obstetrics & Gynecology

## 2019-01-10 DIAGNOSIS — Z1159 Encounter for screening for other viral diseases: Secondary | ICD-10-CM | POA: Diagnosis not present

## 2019-01-10 LAB — SARS CORONAVIRUS 2 (TAT 6-24 HRS): SARS Coronavirus 2: NEGATIVE

## 2019-01-10 NOTE — MAU Note (Signed)
Swab collected without difficulty. 

## 2019-01-11 ENCOUNTER — Ambulatory Visit (INDEPENDENT_AMBULATORY_CARE_PROVIDER_SITE_OTHER): Payer: Medicaid Other | Admitting: Obstetrics & Gynecology

## 2019-01-11 ENCOUNTER — Ambulatory Visit (INDEPENDENT_AMBULATORY_CARE_PROVIDER_SITE_OTHER): Payer: Medicaid Other | Admitting: *Deleted

## 2019-01-11 ENCOUNTER — Ambulatory Visit: Payer: Self-pay

## 2019-01-11 VITALS — BP 115/69 | HR 109 | Wt 163.6 lb

## 2019-01-11 DIAGNOSIS — O0990 Supervision of high risk pregnancy, unspecified, unspecified trimester: Secondary | ICD-10-CM

## 2019-01-11 DIAGNOSIS — O0993 Supervision of high risk pregnancy, unspecified, third trimester: Secondary | ICD-10-CM

## 2019-01-11 DIAGNOSIS — O24415 Gestational diabetes mellitus in pregnancy, controlled by oral hypoglycemic drugs: Secondary | ICD-10-CM | POA: Diagnosis not present

## 2019-01-11 DIAGNOSIS — Z3A38 38 weeks gestation of pregnancy: Secondary | ICD-10-CM

## 2019-01-11 DIAGNOSIS — Z789 Other specified health status: Secondary | ICD-10-CM

## 2019-01-11 NOTE — Progress Notes (Signed)
   PRENATAL VISIT NOTE  Subjective:  Sonya Turner is a 33 y.o. [id number] at [redacted]w[redacted]d being seen today for ongoing prenatal care.  She is currently monitored for the following issues for this high-risk pregnancy and has Heterozygous factor V Leiden affecting pregnancy, antepartum ([place]); Language barrier; Four previous miscarriages, affecting care of mother in second trimester, antepartum; Chronic headaches; Supervision of high risk pregnancy, antepartum; and GDM (gestational diabetes mellitus) on their problem list.  Patient reports no complaints.  Contractions: Irregular. Vag. Bleeding: None.  Movement: Present. Denies leaking of fluid.   The following portions of the patient's history were reviewed and updated as appropriate: allergies, current medications, past family history, past medical history, past social history, past surgical history and problem list.   Objective:   Vitals:   01/11/19 1402  BP: 115/69  Pulse: (!) 109  Weight: 163 lb 9.6 oz (74.2 kg)    Fetal Status: Fetal Heart Rate (bpm): NST   Movement: Present     General:  Alert, oriented and cooperative. Patient is in no acute distress.  Skin: Skin is warm and dry. No rash noted.   Cardiovascular: Normal heart rate noted  Respiratory: Normal respiratory effort, no problems with respiration noted  Abdomen: Soft, gravid, appropriate for gestational age.  Pain/Pressure: Present     Pelvic: Cervical exam performed        Extremities: Normal range of motion.     Mental Status: Normal mood and affect. Normal behavior. Normal judgment and thought content.   Assessment and Plan:  Pregnancy: [id number] at [redacted]w[redacted]d 1. Gestational diabetes mellitus (GDM) in third trimester controlled on oral hypoglycemic drug   2. Supervision of high risk pregnancy, antepartum - IOL in 2 days  3. Language barrier - live interpretor present  Term labor symptoms and general obstetric precautions including but not limited to vaginal bleeding,  contractions, leaking of fluid and fetal movement were reviewed in detail with the patient. Please refer to After Visit Summary for other counseling recommendations.   Return in about 6 weeks (around 02/22/2019) for for pp visit and 2 hour GTT.  Future Appointments  Date Time Provider [place]  01/13/2019  7:00 AM MC-LD SCHED ROOM MC-INDC None    Emily Filbert, MD

## 2019-01-11 NOTE — Progress Notes (Signed)
Interpreter Hanan present for encounter.  IOL scheduled 7/25 @ 0700.

## 2019-01-13 ENCOUNTER — Inpatient Hospital Stay (HOSPITAL_COMMUNITY): Payer: Medicaid Other

## 2019-01-13 ENCOUNTER — Other Ambulatory Visit: Payer: Self-pay

## 2019-01-13 ENCOUNTER — Encounter (HOSPITAL_COMMUNITY): Payer: Self-pay | Admitting: *Deleted

## 2019-01-13 ENCOUNTER — Inpatient Hospital Stay (HOSPITAL_COMMUNITY)
Admission: AD | Admit: 2019-01-13 | Discharge: 2019-01-15 | DRG: 806 | Disposition: A | Discharge status: Home / Self Care | Payer: Medicaid Other | Attending: Obstetrics and Gynecology | Admitting: Obstetrics and Gynecology

## 2019-01-13 DIAGNOSIS — O9912 Other diseases of the blood and blood-forming organs and certain disorders involving the immune mechanism complicating childbirth: Secondary | ICD-10-CM | POA: Diagnosis present

## 2019-01-13 DIAGNOSIS — O24415 Gestational diabetes mellitus in pregnancy, controlled by oral hypoglycemic drugs: Secondary | ICD-10-CM

## 2019-01-13 DIAGNOSIS — D6851 Activated protein C resistance: Secondary | ICD-10-CM | POA: Diagnosis present

## 2019-01-13 DIAGNOSIS — Z3A39 39 weeks gestation of pregnancy: Secondary | ICD-10-CM

## 2019-01-13 DIAGNOSIS — O24425 Gestational diabetes mellitus in childbirth, controlled by oral hypoglycemic drugs: Secondary | ICD-10-CM | POA: Diagnosis not present

## 2019-01-13 DIAGNOSIS — O0990 Supervision of high risk pregnancy, unspecified, unspecified trimester: Secondary | ICD-10-CM

## 2019-01-13 LAB — GLUCOSE, CAPILLARY
Glucose-Capillary: 101 mg/dL — ABNORMAL HIGH (ref 70–99)
Glucose-Capillary: 86 mg/dL (ref 70–99)
Glucose-Capillary: 87 mg/dL (ref 70–99)
Glucose-Capillary: 82 mg/dL (ref 70–99)

## 2019-01-13 LAB — CBC
HCT: 40 % (ref 36.0–46.0)
Hemoglobin: 13.2 g/dL (ref 12.0–15.0)
MCH: 29.3 pg (ref 26.0–34.0)
MCHC: 33 g/dL (ref 30.0–36.0)
MCV: 88.9 fL (ref 80.0–100.0)
Platelets: 243 10*3/uL (ref 150–400)
RBC: 4.5 MIL/uL (ref 3.87–5.11)
RDW: 14.2 % (ref 11.5–15.5)
WBC: 7.2 10*3/uL (ref 4.0–10.5)
nRBC: 0 % (ref 0.0–0.2)

## 2019-01-13 LAB — TYPE AND SCREEN
ABO/RH(D): O POS
Antibody Screen: NEGATIVE

## 2019-01-13 LAB — COMPREHENSIVE METABOLIC PANEL
ALT: 15 U/L (ref 0–44)
AST: 26 U/L (ref 15–41)
Albumin: 2.9 g/dL — ABNORMAL LOW (ref 3.5–5.0)
Alkaline Phosphatase: 122 U/L (ref 38–126)
Anion gap: 13 (ref 5–15)
BUN: <5 mg/dL — ABNORMAL LOW (ref 6–20)
CO2: 17 mmol/L — ABNORMAL LOW (ref 22–32)
Calcium: 9.1 mg/dL (ref 8.9–10.3)
Chloride: 104 mmol/L (ref 98–111)
Creatinine, Ser: 0.49 mg/dL (ref 0.44–1.00)
GFR calc Af Amer: >60 mL/min (ref >60)
GFR calc non Af Amer: >60 mL/min (ref >60)
Glucose, Bld: 90 mg/dL (ref 70–99)
Potassium: 3.8 mmol/L (ref 3.5–5.1)
Sodium: 134 mmol/L — ABNORMAL LOW (ref 135–145)
Total Bilirubin: 0.5 mg/dL (ref 0.3–1.2)
Total Protein: 6.4 g/dL — ABNORMAL LOW (ref 6.5–8.1)

## 2019-01-13 MED ORDER — ZOLPIDEM TARTRATE 5 MG PO TABS: 5.0000 mg | ORAL_TABLET | Freq: Every evening | ORAL | Status: DC | PRN

## 2019-01-13 MED ORDER — LIDOCAINE HCL (PF) 1 % IJ SOLN: 30.0000 mL | INTRAMUSCULAR | Status: DC | PRN

## 2019-01-13 MED ORDER — OXYTOCIN 40 UNITS IN NORMAL SALINE INFUSION - SIMPLE MED
2.5000 [IU]/h | INTRAVENOUS | Status: DC
  Administered 2019-01-14 (×2): 2.5 [IU]/h via INTRAVENOUS

## 2019-01-13 MED ORDER — TERBUTALINE SULFATE 1 MG/ML IJ SOLN: 0.2500 mg | Freq: Once | INTRAMUSCULAR | Status: DC | PRN

## 2019-01-13 MED ORDER — HYDROXYZINE HCL 50 MG PO TABS: 50.0000 mg | ORAL_TABLET | Freq: Four times a day (QID) | ORAL | Status: DC | PRN

## 2019-01-13 MED ORDER — MISOPROSTOL 25 MCG QUARTER TABLET: 25.0000 ug | ORAL_TABLET | ORAL | Status: DC | PRN

## 2019-01-13 MED ORDER — ONDANSETRON HCL 4 MG/2ML IJ SOLN: 4.0000 mg | Freq: Four times a day (QID) | INTRAMUSCULAR | Status: DC | PRN

## 2019-01-13 MED ORDER — OXYCODONE-ACETAMINOPHEN 5-325 MG PO TABS: 2.0000 | ORAL_TABLET | ORAL | Status: DC | PRN

## 2019-01-13 MED ORDER — LACTATED RINGERS IV SOLN: 500.0000 mL | INTRAVENOUS | Status: DC | PRN

## 2019-01-13 MED ORDER — LACTATED RINGERS IV SOLN
INTRAVENOUS | Status: DC
  Administered 2019-01-13 – 2019-01-14 (×4): via INTRAVENOUS

## 2019-01-13 MED ORDER — OXYTOCIN 40 UNITS IN NORMAL SALINE INFUSION - SIMPLE MED
1.0000 m[IU]/min | INTRAVENOUS | Status: DC
  Administered 2019-01-13: 2 m[IU]/min via INTRAVENOUS
  Filled 2019-01-13: qty 1000

## 2019-01-13 MED ORDER — OXYCODONE-ACETAMINOPHEN 5-325 MG PO TABS: 1.0000 | ORAL_TABLET | ORAL | Status: DC | PRN

## 2019-01-13 MED ORDER — OXYTOCIN BOLUS FROM INFUSION
500.0000 mL | Freq: Once | INTRAVENOUS | Status: AC
  Administered 2019-01-14: 500 mL via INTRAVENOUS

## 2019-01-13 MED ORDER — FLEET ENEMA 7-19 GM/118ML RE ENEM: 1.0000 | ENEMA | Freq: Every day | RECTAL | Status: DC | PRN

## 2019-01-13 MED ORDER — MISOPROSTOL 50MCG HALF TABLET
50.0000 ug | ORAL_TABLET | ORAL | Status: DC | PRN
  Administered 2019-01-13 (×2): 50 ug via ORAL
  Filled 2019-01-13 (×2): qty 1

## 2019-01-13 MED ORDER — ACETAMINOPHEN 325 MG PO TABS: 650.0000 mg | ORAL_TABLET | ORAL | Status: DC | PRN

## 2019-01-13 MED ORDER — FENTANYL CITRATE (PF) 100 MCG/2ML IJ SOLN
50.0000 ug | INTRAMUSCULAR | Status: DC | PRN
  Administered 2019-01-13 – 2019-01-14 (×3): 100 ug via INTRAVENOUS
  Filled 2019-01-13 (×3): qty 2

## 2019-01-13 MED ORDER — SOD CITRATE-CITRIC ACID 500-334 MG/5ML PO SOLN: 30.0000 mL | ORAL | Status: DC | PRN

## 2019-01-13 NOTE — H&P (Signed)
OBSTETRIC ADMISSION HISTORY AND PHYSICAL  Cystal Posey ProntoMdalal is a 33 y.o. female [phone] with IUP at 9643w0d by LMP presenting for IOL for A2GDM on metformin. She reports +FMs, No LOF, no VB, no blurry vision, headaches or peripheral edema, and RUQ pain.  She plans on breast feeding. She request IUD for birth control. She [facility]   Dating: By LMP --->  Estimated Date of Delivery: 01/20/19  Nursing Staff Provider  Office Location  [facility] Dating  LMP + 5 wk us  Language  Arabic Anatomy US  "WNL  Flu Vaccine  07/05/2017 Genetic Screen  declined  TDaP vaccine  12/28/18  Hgb A1C or  GTT Early  Third trimester: GDM  HBA1C- 5.2  Rhogam  n/a   LAB RESULTS   Feeding Plan breast Blood Type O/Positive/-- (01/15 1142)   Contraception unsure Antibody Negative (01/15 1142)  Circumcision Yes outpatient Rubella 8.67 (01/15 1142)  Pediatrician   RPR Non Reactive (01/15 1142)   Support Person Shada (FOB) HBsAg Negative (01/15 1142)   Prenatal Classes declined HIV Non Reactive (01/15 1142)  BTL Consent  GBS    VBAC Consent  Pap     Hgb Electro  normal    CF Neg    SMA     Waterbirth  [ ]  Class [ ]  Consent [ ]  CNM visit   Prenatal History/Complications:  Past Medical History: Past Medical History:  Diagnosis Date  . Diabetes mellitus without complication (HCC)   . Factor V Leiden (HCC)   . Foot fracture, left   . Gestational diabetes   . Headache   . Tachycardia   . Wrist fracture    left    Past Surgical History: Past Surgical History:  Procedure Laterality Date  . APPENDECTOMY      Obstetrical History: OB History    Gravida  9   Para  4   Term  4   Preterm      AB  4   Living  4     SAB  4   TAB      Ectopic      Multiple  0   Live Births  4           Social History: Social History   Socioeconomic History  . Marital status: Married    Spouse name: Not on file  . Number of children: Not on file  . Years of education: Not on file  . Highest  education level: Not on file  Occupational History  . Not on file  Social Needs  . Financial resource strain: Not on file  . Food insecurity    Worry: Never true    Inability: Never true  . Transportation needs    Medical: No    Non-medical: No  Tobacco Use  . Smoking status: Never Smoker  . Smokeless tobacco: Never Used  Substance and Sexual Activity  . Alcohol use: No  . Drug use: No  . Sexual activity: Yes    Birth control/protection: None  Lifestyle  . Physical activity    Days per week: Not on file    Minutes per session: Not on file  . Stress: Not on file  Relationships  . Social Musicianconnections    Talks on phone: Not on file    Gets together: Not on file    Attends religious service: Not on file    Active member of club or organization: Not on file  Attends meetings of clubs or organizations: Not on file    Relationship status: Not on file  Other Topics Concern  . Not on file  Social History Narrative  . Not on file    Family History: Family History  Problem Relation Age of Onset  . Diabetes Mother   . Hypertension Mother   . Healthy Father   . Hearing loss Neg Hx     Allergies: No Known Allergies  Medications Prior to Admission  Medication Sig Dispense Refill Last Dose  . Accu-Chek FastClix Lancets MISC 1 Device by Percutaneous route 4 (four) times daily. 100 each 12   . enoxaparin (LOVENOX) 40 MG/0.4ML injection Inject 0.4 mLs (40 mg total) into the skin daily. 40 ml prefilled syringes 1 qd 0.4 mL 2   . glucose blood (ACCU-CHEK GUIDE) test strip Use as instructed QID 100 each 12   . metFORMIN (GLUCOPHAGE) 1000 MG tablet Take 1 tablet (1,000 mg total) by mouth daily with supper. 30 tablet 1   . pantoprazole (PROTONIX) 20 MG tablet Take 1 tablet (20 mg total) by mouth daily. 30 tablet 1   . Prenatal Vit-Fe Fumarate-FA (PRENATAL VITAMIN) 27-0.8 MG TABS Take 1 tablet by mouth daily. 90 tablet 2      Review of Systems   All systems reviewed and  negative except as stated in HPI  Blood pressure (!) 102/59, pulse 86, height 5\' 2"  (1.575 m), weight 72 kg, last menstrual period 04/15/2018, currently breastfeeding. General appearance: alert, cooperative and no distress Lungs: clear to auscultation bilaterally Heart: regular rate and rhythm Abdomen: soft, non-tender; bowel sounds normal Pelvic: n/a Extremities: Homans sign is negative, no sign of DVT DTR's +2 Presentation: cephalic- confirmed by bedside u/s Fetal monitoringBaseline: 120 bpm, Variability: Good {> 6 bpm), Accelerations: Reactive and Decelerations: Absent Uterine activityNone Dilation: 1 Effacement (%): Thick Exam by:: Maryruth Hancock, CNM   Prenatal labs: ABO, Rh: --/--/O POS, O POS Performed at [facility], [place] [address], [place], [place] [address]  [phone]/25 0800) Antibody: NEG (07/25 0800) Rubella: 8.67 (01/15 1142) RPR: Non Reactive (05/18 0911)  HBsAg: Negative (01/15 1142)  HIV: Non Reactive (05/18 0911)  GBS:  Negative  Prenatal Transfer Tool  Maternal Diabetes: Yes:  Diabetes Type:  Insulin/Medication controlled Genetic Screening: Declined Maternal Ultrasounds/Referrals: Normal Fetal Ultrasounds or other Referrals:  Referred to Materal Fetal Medicine  Maternal Substance Abuse:  No Significant Maternal Medications:  Meds include: Other: metformin Significant Maternal Lab Results: Group B Strep negative  Results for orders placed or performed during the hospital encounter of 01/13/19 (from the past 24 hour(s))  CBC   Collection Time: 01/13/19  8:00 AM  Result Value Ref Range   WBC 7.2 4.0 - 10.5 K/uL   RBC 4.50 3.87 - 5.11 MIL/uL   Hemoglobin 13.2 12.0 - 15.0 g/dL   HCT 40.0 36.0 - 46.0 %   MCV 88.9 80.0 - 100.0 fL   MCH 29.3 26.0 - 34.0 pg   MCHC 33.0 30.0 - 36.0 g/dL   RDW 14.2 11.5 - 15.5 %   Platelets 243 150 - 400 K/uL   nRBC 0.0 0.0 - 0.2 %  Comprehensive metabolic panel   Collection Time: 01/13/19  8:00 AM  Result Value Ref Range    Sodium 134 (L) 135 - 145 mmol/L   Potassium 3.8 3.5 - 5.1 mmol/L   Chloride 104 98 - 111 mmol/L   CO2 17 (L) 22 - 32 mmol/L   Glucose, Bld 90 70 -  99 mg/dL   BUN <5 (L) 6 - 20 mg/dL   Creatinine, Ser 7.820.49 0.44 - 1.00 mg/dL   Calcium 9.1 8.9 - 95.610.3 mg/dL   Total Protein 6.4 (L) 6.5 - 8.1 g/dL   Albumin 2.9 (L) 3.5 - 5.0 g/dL   AST 26 15 - 41 U/L   ALT 15 0 - 44 U/L   Alkaline Phosphatase 122 38 - 126 U/L   Total Bilirubin 0.5 0.3 - 1.2 mg/dL   GFR calc non Af Amer >60 >60 mL/min   GFR calc Af Amer >60 >60 mL/min   Anion gap 13 5 - 15  Type and screen   Collection Time: 01/13/19  8:00 AM  Result Value Ref Range   ABO/RH(D) O POS    Antibody Screen NEG    Sample Expiration      01/16/2019,2359 Performed at [facility], [address] [place], [place] [address]   ABO/Rh   Collection Time: 01/13/19  8:00 AM  Result Value Ref Range   ABO/RH(D)      O POS Performed at [facility], [address] [place], [place] [address]     Patient Active Problem List   Diagnosis Date Noted  . Indication for care in labor or delivery 01/13/2019  . GDM (gestational diabetes mellitus) 11/22/2018  . Supervision of high risk pregnancy, antepartum 07/05/2018  . Chronic headaches 07/29/2017  . Heterozygous factor V Leiden affecting pregnancy, antepartum (HCC) 05/29/2015  . Language barrier 05/29/2015  . Four previous miscarriages, affecting care of mother in second trimester, antepartum 05/29/2015    Assessment/Plan:  Khristian Dejaynes is a 33 y.o. [id number] at 6348w0d here for IOL for A2GDM  #Labor: cytotec and FB when able. #Pain: Per patient request, planning IV medication #FWB: Cat 1 #ID:  GBS neg #MOF: Breast #MOC: IUD #Circ:  Yes, outpatient  Rolm Bookbinderaroline M Hiilei Gerst, CNM  01/13/2019, 9:54 AM

## 2019-01-13 NOTE — Progress Notes (Signed)
LABOR PROGRESS NOTE  Sonya Turner is a 33 y.o. [id number] at [redacted]w[redacted]d admitted for IOL for A2GDM.  Subjective: Doing well and breathing through contractions. No concerns at this time.  Objective: BP 101/64 (BP Location: Left Arm)   Pulse 86   Temp (!) 97.5 F (36.4 C) (Oral)   Resp 18   Ht 5\' 2"  (1.575 m)   Wt 72 kg   LMP 04/15/2018   BMI 29.03 kg/m  or  Vitals:   01/13/19 1920 01/13/19 1942 01/13/19 2010 01/13/19 2111  BP:  111/67 111/65 101/64  Pulse:  89 90 86  Resp:  18 18 18   Temp: (!) 97.5 F (36.4 C)     TempSrc: Oral     Weight:      Height:       Dilation: 4 Effacement (%): 60 Cervical Position: Middle Station: -2 Presentation: Vertex Exam by:: Len Blalock CNM FHT: Baseline rate 125, moderate varibility, +accels, -decels Toco: Contractions every ~2 minutes  Labs: Lab Results  Component Value Date   WBC 7.2 01/13/2019   HGB 13.2 01/13/2019   HCT 40.0 01/13/2019   MCV 88.9 01/13/2019   PLT 243 01/13/2019    Patient Active Problem List   Diagnosis Date Noted  . Indication for care in labor or delivery 01/13/2019  . GDM (gestational diabetes mellitus) 11/22/2018  . Supervision of high risk pregnancy, antepartum 07/05/2018  . Chronic headaches 07/29/2017  . Heterozygous factor V Leiden affecting pregnancy, antepartum ([place]) 05/29/2015  . Language barrier 05/29/2015  . Four previous miscarriages, affecting care of mother in second trimester, antepartum 05/29/2015    Assessment / Plan: 33 y.o. [id number] at [redacted]w[redacted]d here for IOL for A2GDM.  Labor: Progressing well with regular contractions. Will defer cervical exam until about ~4 from last check. Continue Pitocin 2x2 per protocol.  Fetal Wellbeing: Category 1 tracing as above Pain Control: IV pain medication; would like to continue with this for now Anticipated MOD: SVD  Vilma Meckel, MD  Family Medicine, PGY-2 01/13/2019, 9:37 PM

## 2019-01-13 NOTE — Progress Notes (Signed)
Labor Progress Note Sonya Turner is a 33 y.o. [phone] at [redacted]w[redacted]d presented for IOL for A2GDM  S:  Patient comfortable  O:  BP (!) 112/58   Pulse 87   Resp 18   Ht 5\' 2"  (1.575 m)   Wt 72 kg   LMP 04/15/2018   BMI 29.03 kg/m   Fetal Tracing:  Baseline: 120 Variability: moderate Accels: 15x15 Decels: none  Toco: 4-5   CVE: Dilation: 2 Effacement (%): 50 Cervical Position: Middle Station: -2 Presentation: Vertex Exam by:: Rosana Hoes, RN    A&P: 33 y.o. E3O1224 [redacted]w[redacted]d IOL for A2GDM #Labor: Progressing well. Continue cytotec #Pain: per patient request #FWB: Cat 1 #GBS negative  Wende Mott, CNM 2:29 PM

## 2019-01-13 NOTE — Progress Notes (Signed)
Labor Progress Note Kalynne Coles is a 33 y.o. [phone] at [redacted]w[redacted]d presented for IOL for A2GDM  S:  Patient feeling cramps but wanting to speed labor up  O:  BP (!) 99/59   Pulse 89   Resp 18   Ht 5\' 2"  (1.575 m)   Wt 72 kg   LMP 04/15/2018   BMI 29.03 kg/m   Fetal Tracing:  Baseline: 135 Variability: moderate Accels: 15x15 Decels: none  Toco: 2-3   CVE: Dilation: 4 Effacement (%): 60 Cervical Position: Middle Station: -2 Presentation: Vertex Exam by:: Len Blalock CNM   A&P: 33 y.o. [id number] [redacted]w[redacted]d IOL for A2GDM #Labor: Progressing well. Patient requesting to start pitocin for augmentation. Cervix now more favorable, will start pit 2x2 #Pain: IV pain medicine #FWB: Cat 1 #GBS negative   Wende Mott, CNM 7:02 PM

## 2019-01-14 ENCOUNTER — Encounter (HOSPITAL_COMMUNITY): Payer: Self-pay

## 2019-01-14 DIAGNOSIS — Z3A39 39 weeks gestation of pregnancy: Secondary | ICD-10-CM

## 2019-01-14 DIAGNOSIS — O24425 Gestational diabetes mellitus in childbirth, controlled by oral hypoglycemic drugs: Secondary | ICD-10-CM

## 2019-01-14 LAB — ABO/RH: ABO/RH(D): O POS

## 2019-01-14 LAB — GLUCOSE, CAPILLARY: Glucose-Capillary: 78 mg/dL (ref 70–99)

## 2019-01-14 MED ORDER — MEASLES, MUMPS & RUBELLA VAC IJ SOLR: 0.5000 mL | Freq: Once | INTRAMUSCULAR | Status: DC

## 2019-01-14 MED ORDER — ZOLPIDEM TARTRATE 5 MG PO TABS: 5.0000 mg | ORAL_TABLET | Freq: Every evening | ORAL | Status: DC | PRN

## 2019-01-14 MED ORDER — ONDANSETRON HCL 4 MG/2ML IJ SOLN: 4.0000 mg | INTRAMUSCULAR | Status: DC | PRN

## 2019-01-14 MED ORDER — DIBUCAINE (PERIANAL) 1 % EX OINT: 1.0000 "application " | TOPICAL_OINTMENT | CUTANEOUS | Status: DC | PRN

## 2019-01-14 MED ORDER — PRENATAL MULTIVITAMIN CH
1.0000 | ORAL_TABLET | Freq: Every day | ORAL | Status: DC
  Administered 2019-01-14 – 2019-01-15 (×2): 1 via ORAL
  Filled 2019-01-14 (×2): qty 1

## 2019-01-14 MED ORDER — ONDANSETRON HCL 4 MG PO TABS: 4.0000 mg | ORAL_TABLET | ORAL | Status: DC | PRN

## 2019-01-14 MED ORDER — SIMETHICONE 80 MG PO CHEW: 80.0000 mg | CHEWABLE_TABLET | ORAL | Status: DC | PRN

## 2019-01-14 MED ORDER — IBUPROFEN 600 MG PO TABS
600.0000 mg | ORAL_TABLET | Freq: Four times a day (QID) | ORAL | Status: DC
  Administered 2019-01-14 – 2019-01-15 (×4): 600 mg via ORAL
  Filled 2019-01-14 (×5): qty 1

## 2019-01-14 MED ORDER — ENOXAPARIN SODIUM 40 MG/0.4ML ~~LOC~~ SOLN
40.0000 mg | SUBCUTANEOUS | Status: DC
  Filled 2019-01-14: qty 0.4

## 2019-01-14 MED ORDER — SENNOSIDES-DOCUSATE SODIUM 8.6-50 MG PO TABS
2.0000 | ORAL_TABLET | ORAL | Status: DC
  Administered 2019-01-14: 23:00:00 2 via ORAL
  Filled 2019-01-14: qty 2

## 2019-01-14 MED ORDER — TETANUS-DIPHTH-ACELL PERTUSSIS 5-2.5-18.5 LF-MCG/0.5 IM SUSP: 0.5000 mL | Freq: Once | INTRAMUSCULAR | Status: DC

## 2019-01-14 MED ORDER — WITCH HAZEL-GLYCERIN EX PADS: 1.0000 "application " | MEDICATED_PAD | CUTANEOUS | Status: DC | PRN

## 2019-01-14 MED ORDER — BENZOCAINE-MENTHOL 20-0.5 % EX AERO: 1.0000 "application " | INHALATION_SPRAY | CUTANEOUS | Status: DC | PRN

## 2019-01-14 MED ORDER — ACETAMINOPHEN 325 MG PO TABS
650.0000 mg | ORAL_TABLET | ORAL | Status: DC | PRN
  Administered 2019-01-14: 08:00:00 650 mg via ORAL
  Filled 2019-01-14: qty 2

## 2019-01-14 MED ORDER — DIPHENHYDRAMINE HCL 25 MG PO CAPS: 25.0000 mg | ORAL_CAPSULE | Freq: Four times a day (QID) | ORAL | Status: DC | PRN

## 2019-01-14 MED ORDER — COCONUT OIL OIL: 1.0000 "application " | TOPICAL_OIL | Status: DC | PRN

## 2019-01-14 MED ORDER — OXYCODONE HCL 5 MG PO TABS: 10.0000 mg | ORAL_TABLET | ORAL | Status: DC | PRN

## 2019-01-14 MED ORDER — OXYCODONE HCL 5 MG PO TABS
5.0000 mg | ORAL_TABLET | ORAL | Status: DC | PRN
  Administered 2019-01-14: 19:00:00 5 mg via ORAL
  Filled 2019-01-14: qty 1

## 2019-01-14 NOTE — Discharge Summary (Addendum)
OB Discharge Summary     Patient Name: Sonya Turner DOB: March 18, 1986 MRN: [id number]  Date of admission: 01/13/2019 Delivering MD: Vilma Meckel M   Date of discharge: 01/15/2019  Admitting diagnosis: pregnancy Intrauterine pregnancy: [redacted]w[redacted]d     Secondary diagnosis:  Active Problems:   Indication for care in labor or delivery  Additional problems: A2GDM, Factor V- heterzygous     Discharge diagnosis: Term Pregnancy Delivered                                                                                                Post partum procedures:None  Augmentation: AROM, Pitocin and Cytotec  Complications: None  Hospital course:  Induction of Labor With Vaginal Delivery   33 y.o. yo [phone] at [redacted]w[redacted]d was admitted to the hospital 01/13/2019 for induction of labor.  Indication for induction: A2 DM.  Patient had an uncomplicated labor course as follows: Induction of labor with Cytotec x2 and Pitocin. AROM performed with progression to completed dilation and subsequent SVD without complications. Membrane Rupture Time/Date: 3:18 AM ,01/14/2019   Intrapartum Procedures: Episiotomy: None [1]                                         Lacerations:  None [1]  Patient had delivery of a Viable infant.  Information for the patient's newborn:  Madalaine, [place]ier [[id number]]  Delivery Method: Vaginal, Spontaneous(Filed from Delivery Summary)    01/14/2019  Details of delivery can be found in separate delivery note.  Patient had a routine postpartum course. Patient is discharged home 01/15/19. Sent home on Lovenox, per Dr. Rip Harbour pt on Lovenox during pregnancy and insists continuing postpartum x6 weeks.  Physical exam  Vitals:   01/14/19 1159 01/14/19 1615 01/14/19 2100 01/15/19 0534  BP: 98/61 97/63 102/68 (!) 97/58  Pulse: 80 82 79 75  Resp: 18 18 18 18   Temp: 98.2 F (36.8 C) 98.1 F (36.7 C) 98.2 F (36.8 C) 97.9 F (36.6 C)  TempSrc: Oral Axillary Oral Oral  SpO2: 95% 98%    Weight:       Height:       General: alert, cooperative and no distress Lochia: appropriate Uterine Fundus: firm Incision: N/A DVT Evaluation: No evidence of DVT seen on physical exam. Negative Homan's sign. No cords or calf tenderness. No significant calf/ankle edema. Labs: Lab Results  Component Value Date   WBC 7.2 01/13/2019   HGB 13.2 01/13/2019   HCT 40.0 01/13/2019   MCV 88.9 01/13/2019   PLT 243 01/13/2019   CMP Latest Ref Rng & Units 01/13/2019  Glucose 70 - 99 mg/dL 90  BUN 6 - 20 mg/dL <5(L)  Creatinine 0.44 - 1.00 mg/dL 0.49  Sodium 135 - 145 mmol/L 134(L)  Potassium 3.5 - 5.1 mmol/L 3.8  Chloride 98 - 111 mmol/L 104  CO2 22 - 32 mmol/L 17(L)  Calcium 8.9 - 10.3 mg/dL 9.1  Total Protein 6.5 - 8.1 g/dL 6.4(L)  Total Bilirubin 0.3 - 1.2 mg/dL 0.5  Alkaline Phos 38 - 126 U/L 122  AST 15 - 41 U/L 26  ALT 0 - 44 U/L 15    Discharge instruction: per After Visit Summary and "Baby and Me Booklet".  After visit meds:  Allergies as of 01/15/2019   No Known Allergies     Medication List    STOP taking these medications   Accu-Chek FastClix Lancets Misc   Accu-Chek Guide test strip Generic drug: glucose blood   metFORMIN 1000 MG tablet Commonly known as: Glucophage     TAKE these medications   enoxaparin 40 MG/0.4ML injection Commonly known as: LOVENOX Inject 0.4 mLs (40 mg total) into the skin daily. 40 ml prefilled syringes 1 qd   ibuprofen 600 MG tablet Commonly known as: ADVIL Take 1 tablet (600 mg total) by mouth every 6 (six) hours.   pantoprazole 20 MG tablet Commonly known as: Protonix Take 1 tablet (20 mg total) by mouth daily.   Prenatal Vitamin 27-0.8 MG Tabs Take 1 tablet by mouth daily.       Diet: routine diet  Activity: Advance as tolerated. Pelvic rest for 6 weeks.   Outpatient follow up:4 weeks Follow up Appt: Future Appointments  Date Time Provider Department Center  02/28/2019  8:15 AM Allie Bossierove, Myra C, MD [facility]  02/28/2019  8:50  AM [facility]   Follow up Visit:No follow-ups on file.  Postpartum contraception: IUD Mirena  Newborn Data: Live born female  Birth Weight:   APGAR: 9, 9  Newborn Delivery   Birth date/time: 01/14/2019 05:16:00 Delivery type: Vaginal, Spontaneous     Baby Feeding: Breast Disposition:home with mother  Video interpreter used for encounter  01/15/2019 Donette LarryMelanie Lashara Urey, CNM

## 2019-01-14 NOTE — Lactation Note (Signed)
This note was copied from a baby's chart. Lactation Consultation Note  Patient Name: Sonya Turner Today's Date: 01/14/2019 Reason for consult: Initial assessment;Maternal endocrine disorder;Term Type of Endocrine Disorder?: Diabetes(GDM)  Used [place] interpreter services for Arabic, [place] interpreter # [phone]  39 hours old FT female who is being exclusively BF by his mother, she's a P5 and experienced BF. She was able to BF her other kids for 15 months, and she's already familiar with hand expression; mom's nipples are everted, she told LC she's already getting colostrum, she's been leaking. Mom had GDM, she was on Metformin.  Offered assistance with latch and mom but mom politely declined; baby was asleep. No LATCH score in flowsheets yet. Explained to mom that she needs to feed her baby on cues not going more than 4 hours without a feeding, mom told LC the last time her baby fed was 4 hours ago. Encouraged mom to wake baby up to feed soon if she didn't require any assistance with lactation; she voiced understanding. Reviewed normal newborn behavior, cluster feeding, STS care (baby was fully clothed) and feeding cues.  Feeding plan:  1. Encouraged mom to feed baby STS 8-12 times/24 hours or sooner if feeding cues are present 2. Hand expression and spoon feeding were also strongly encouraged  BF brochure, BF resources and feeding diary were reviewed. Mom reported all questions and concerns were answered, she's aware of [place] services and will call PRN.  Maternal Data Formula Feeding for Exclusion: No Has patient been taught Hand Expression?: Yes Does the patient have breastfeeding experience prior to this delivery?: Yes  Feeding    Interventions Interventions: Breast feeding basics reviewed  Lactation Tools Discussed/Used WIC Program: No   Consult Status Consult Status: Follow-up Date: 01/15/19 Follow-up type: In-patient    Sonya Turner 01/14/2019, 10:26 PM

## 2019-01-14 NOTE — Progress Notes (Signed)
LABOR PROGRESS NOTE  Sonya Turner is a 33 y.o. [id number] at [redacted]w[redacted]d admitted for IOL for A2GDM.  Subjective: Doing well but getting more tired overall. No specific concerns at this time.  Objective: BP 108/64 (BP Location: Left Arm)   Pulse 91   Temp 97.8 F (36.6 C) (Oral)   Resp 18   Ht 5\' 2"  (1.575 m)   Wt 72 kg   LMP 04/15/2018   BMI 29.03 kg/m  or  Vitals:   01/13/19 2250 01/13/19 2330 01/14/19 0007 01/14/19 0037  BP: (!) 103/57 (!) 97/57 100/69 108/64  Pulse: 80 97 95 91  Resp:  18  18  Temp: 97.8 F (36.6 C)     TempSrc: Oral     Weight:      Height:       Dilation: 5.5 Effacement (%): 80 Cervical Position: Middle Station: -2 Presentation: Vertex Exam by:: Dr Gwenlyn Perking, resident [place]: Baseline rate 130, moderate varibility, +accels, -decels Toco: Contractions every 1-3 minutes  Labs: Lab Results  Component Value Date   WBC 7.2 01/13/2019   HGB 13.2 01/13/2019   HCT 40.0 01/13/2019   MCV 88.9 01/13/2019   PLT 243 01/13/2019    Patient Active Problem List   Diagnosis Date Noted  . Indication for care in labor or delivery 01/13/2019  . GDM (gestational diabetes mellitus) 11/22/2018  . Supervision of high risk pregnancy, antepartum 07/05/2018  . Chronic headaches 07/29/2017  . Heterozygous factor V Leiden affecting pregnancy, antepartum ([place]) 05/29/2015  . Language barrier 05/29/2015  . Four previous miscarriages, affecting care of mother in second trimester, antepartum 05/29/2015    Assessment / Plan: 33 y.o. [id number] at [redacted]w[redacted]d here for IOL for A2GDM.  Labor: Progressing well; will continue Pitocin per protocol and plan for AROM at next evaluation in ~2 hours.  Fetal Wellbeing: Cat 1 as above  Pain Control: IV pain medication PRN Anticipated MOD: SVD  Vilma Meckel, MD  Family Medicine, PGY-2 01/14/2019, 1:17 AM

## 2019-01-14 NOTE — Progress Notes (Signed)
LABOR PROGRESS NOTE  Sonya Turner is a 33 y.o. [id number] at [redacted]w[redacted]d admitted for IOL for A2GDM.  Subjective: Doing well and breathing through contractions. No other complaints at this time.  Objective: BP 120/73 (BP Location: Left Arm)   Pulse (!) 109   Temp 97.6 F (36.4 C) (Oral)   Resp 18   Ht 5\' 2"  (1.575 m)   Wt 72 kg   LMP 04/15/2018   BMI 29.03 kg/m  or  Vitals:   01/14/19 0122 01/14/19 0232 01/14/19 0305 01/14/19 0326  BP: 107/64 113/65 112/69 120/73  Pulse: 88 91 (!) 106 (!) 109  Resp: 18 18    Temp:  97.6 F (36.4 C)    TempSrc:  Oral    Weight:      Height:       Dilation: 5 Effacement (%): 60 Cervical Position: Middle Station: -2 Presentation: Vertex Exam by:: Dr Gwenlyn Perking, resident [place]: Baseline rate 120, moderate varibility, +accel, -decel Toco: Contractions every 2-3 minutes  Labs: Lab Results  Component Value Date   WBC 7.2 01/13/2019   HGB 13.2 01/13/2019   HCT 40.0 01/13/2019   MCV 88.9 01/13/2019   PLT 243 01/13/2019    Patient Active Problem List   Diagnosis Date Noted  . Indication for care in labor or delivery 01/13/2019  . GDM (gestational diabetes mellitus) 11/22/2018  . Supervision of high risk pregnancy, antepartum 07/05/2018  . Chronic headaches 07/29/2017  . Heterozygous factor V Leiden affecting pregnancy, antepartum ([place]) 05/29/2015  . Language barrier 05/29/2015  . Four previous miscarriages, affecting care of mother in second trimester, antepartum 05/29/2015    Assessment / Plan: 33 y.o. E5U3149 at [redacted]w[redacted]d here for IOL for A2GDM.  Labor: Upon reassessment, patient actually minimally changed from prior cervical exam. AROM performed with clear fluid and bloody show. IUPC placed as well to better monitor adequacy of contractions. Will continue Pitocin 2x2 and reassess in ~3-4 hours. Fetal Wellbeing: Category 1 tracing as above Pain Control: IV pain medication PRN Anticipated MOD: SVD  Vilma Meckel, MD Family Medicine,  PGY-2 01/14/2019, 3:39 AM

## 2019-01-15 LAB — RPR: RPR Ser Ql: NONREACTIVE

## 2019-01-15 MED ORDER — IBUPROFEN 600 MG PO TABS: 600.0000 mg | ORAL_TABLET | Freq: Four times a day (QID) | ORAL | 0 refills | Status: AC

## 2019-01-15 NOTE — Lactation Note (Signed)
This note was copied from a baby's chart. Lactation Consultation Note:  P5, infant is term and is now 80 hours old.  [place], [place] [id number].  Reviewed cue base feeding . Mother attempting to latch infant with infant swaddled tightly in a heavy blanket and two layer of clothing.  Lots of teaching with mother on [place] infant with STS. Mothers breast are full. She is able to express large amts of milk.  Discussed S/S of Mastitis. Encouraged mother to use pillow support and to care for herself with napping frequently when at home.  Discussed engorgement and advised to have infant to drain breast well when milk coming to volume.  Mother reports that she only has husband for all support.  Mother pleasant and very receptive to all teaching.   Patient Name: Sonya Turner Today's Date: 01/15/2019 Reason for consult: Follow-up assessment Type of Endocrine Disorder?: Diabetes   Maternal Data    Feeding Feeding Type: Breast Fed  LATCH Score Latch: Grasps breast easily, tongue down, lips flanged, rhythmical sucking.  Audible Swallowing: A few with stimulation  Type of Nipple: Everted at rest and after stimulation  Comfort (Breast/Nipple): Soft / non-tender  Hold (Positioning): Assistance needed to correctly position infant at breast and maintain latch.  LATCH Score: 8  Interventions Interventions: Assisted with latch;Hand express;Breast compression;Adjust position;Support pillows;Position options;Hand pump  Lactation Tools Discussed/Used     Consult Status Consult Status: Complete    Darla Lesches 01/15/2019, 3:59 PM

## 2019-01-18 ENCOUNTER — Other Ambulatory Visit: Payer: Medicaid Other

## 2019-01-18 ENCOUNTER — Encounter: Payer: Medicaid Other | Admitting: Family Medicine

## 2019-01-21 ENCOUNTER — Encounter (HOSPITAL_COMMUNITY): Payer: Self-pay | Admitting: Emergency Medicine

## 2019-01-21 ENCOUNTER — Emergency Department (HOSPITAL_COMMUNITY)
Admission: EM | Admit: 2019-01-21 | Discharge: 2019-01-21 | Disposition: A | Discharge status: Home / Self Care | Payer: Medicaid Other | Attending: Emergency Medicine | Admitting: Emergency Medicine

## 2019-01-21 ENCOUNTER — Emergency Department (HOSPITAL_COMMUNITY): Payer: Medicaid Other

## 2019-01-21 ENCOUNTER — Other Ambulatory Visit: Payer: Self-pay

## 2019-01-21 DIAGNOSIS — R200 Anesthesia of skin: Secondary | ICD-10-CM | POA: Diagnosis not present

## 2019-01-21 DIAGNOSIS — G510 Bell's palsy: Secondary | ICD-10-CM | POA: Insufficient documentation

## 2019-01-21 DIAGNOSIS — R51 Headache: Secondary | ICD-10-CM | POA: Diagnosis not present

## 2019-01-21 DIAGNOSIS — O99355 Diseases of the nervous system complicating the puerperium: Secondary | ICD-10-CM | POA: Insufficient documentation

## 2019-01-21 DIAGNOSIS — Z79899 Other long term (current) drug therapy: Secondary | ICD-10-CM | POA: Insufficient documentation

## 2019-01-21 DIAGNOSIS — Z7901 Long term (current) use of anticoagulants: Secondary | ICD-10-CM | POA: Insufficient documentation

## 2019-01-21 DIAGNOSIS — O2413 Pre-existing diabetes mellitus, type 2, in the puerperium: Secondary | ICD-10-CM | POA: Insufficient documentation

## 2019-01-21 LAB — COMPREHENSIVE METABOLIC PANEL
ALT: 23 U/L (ref 0–44)
AST: 21 U/L (ref 15–41)
Albumin: 3.7 g/dL (ref 3.5–5.0)
Alkaline Phosphatase: 85 U/L (ref 38–126)
Anion gap: 11 (ref 5–15)
BUN: 11 mg/dL (ref 6–20)
CO2: 18 mmol/L — ABNORMAL LOW (ref 22–32)
Calcium: 9.5 mg/dL (ref 8.9–10.3)
Chloride: 108 mmol/L (ref 98–111)
Creatinine, Ser: 0.49 mg/dL (ref 0.44–1.00)
GFR calc Af Amer: >60 mL/min (ref >60)
GFR calc non Af Amer: >60 mL/min (ref >60)
Glucose, Bld: 91 mg/dL (ref 70–99)
Potassium: 3.9 mmol/L (ref 3.5–5.1)
Sodium: 137 mmol/L (ref 135–145)
Total Bilirubin: 0.6 mg/dL (ref 0.3–1.2)
Total Protein: 7.6 g/dL (ref 6.5–8.1)

## 2019-01-21 LAB — CBC WITH DIFFERENTIAL/PLATELET
Abs Immature Granulocytes: 0.01 10*3/uL (ref 0.00–0.07)
Basophils Absolute: 0.1 10*3/uL (ref 0.0–0.1)
Basophils Relative: 1 %
Eosinophils Absolute: 0.2 10*3/uL (ref 0.0–0.5)
Eosinophils Relative: 2 %
HCT: 46.6 % — ABNORMAL HIGH (ref 36.0–46.0)
Hemoglobin: 15.4 g/dL — ABNORMAL HIGH (ref 12.0–15.0)
Immature Granulocytes: 0 %
Lymphocytes Relative: 39 %
Lymphs Abs: 3.1 10*3/uL (ref 0.7–4.0)
MCH: 29.2 pg (ref 26.0–34.0)
MCHC: 33 g/dL (ref 30.0–36.0)
MCV: 88.4 fL (ref 80.0–100.0)
Monocytes Absolute: 0.5 10*3/uL (ref 0.1–1.0)
Monocytes Relative: 6 %
Neutro Abs: 4.1 10*3/uL (ref 1.7–7.7)
Neutrophils Relative %: 52 %
Platelets: 347 10*3/uL (ref 150–400)
RBC: 5.27 MIL/uL — ABNORMAL HIGH (ref 3.87–5.11)
RDW: 13.3 % (ref 11.5–15.5)
WBC: 8.1 10*3/uL (ref 4.0–10.5)
nRBC: 0 % (ref 0.0–0.2)

## 2019-01-21 MED ORDER — VALACYCLOVIR HCL 1 G PO TABS: 1000.0000 mg | ORAL_TABLET | Freq: Three times a day (TID) | ORAL | 0 refills | Status: AC

## 2019-01-21 MED ORDER — VALACYCLOVIR HCL 500 MG PO TABS
1000.0000 mg | ORAL_TABLET | Freq: Once | ORAL | Status: AC
  Administered 2019-01-21: 1000 mg via ORAL
  Filled 2019-01-21: qty 2

## 2019-01-21 MED ORDER — PREDNISONE 20 MG PO TABS
60.0000 mg | ORAL_TABLET | Freq: Once | ORAL | Status: AC
  Administered 2019-01-21: 08:00:00 60 mg via ORAL
  Filled 2019-01-21: qty 3

## 2019-01-21 MED ORDER — PREDNISONE 20 MG PO TABS: 60.0000 mg | ORAL_TABLET | Freq: Every day | ORAL | 0 refills | Status: AC

## 2019-01-21 NOTE — ED Triage Notes (Signed)
Patient reports left facial pain with swelling onset 4 days ago , intermittent right eye twitchings and mouth numbness 2 days ago . Recent child birth 6 days ago .

## 2019-01-21 NOTE — ED Notes (Signed)
Call MRI 

## 2019-01-21 NOTE — ED Notes (Signed)
Patient verbalizes understanding of discharge instructions. Opportunity for questioning and answers were provided. Armband removed by staff, pt discharged from ED.   Interpreter used during discharge.

## 2019-01-21 NOTE — Discharge Instructions (Addendum)
Take the steroids and antiviral medications as prescribed.  Follow-up with your doctor.  Return to the ED develop new or worsening symptoms.

## 2019-01-21 NOTE — ED Notes (Signed)
Per pharmacist, steroids and Valtrex are safe for breastfeeding.

## 2019-01-21 NOTE — ED Notes (Signed)
Ok per MD for patient to go to car and breast feed newborn then return for IV start and MRV.  Per MRI the have a patient OTW from floor then she will be next.

## 2019-01-21 NOTE — ED Provider Notes (Signed)
[facility] Provider Note   CSN: [id number] Arrival date & time: 01/21/19  0146     History   Chief Complaint Chief Complaint  Patient presents with   Facial Pain   Eye Twitching    HPI Sonya Turner is a 33 y.o. female.     Patient 6 days postpartum.  Level 5 caveat for language barrier.  She presents with perceived facial swelling to the left side of her face for the past 4 days.  She is noticed some intermittent right eye twitching and some numbness to the right side of her mouth.  She feels like her smile is asymmetric.  She feels there is some numbness to the right side of her mouth.  She feels that the left side of her face is swollen and painful.  She denies any dental pain.  There is no focal weakness in her arms or legs.  There is no chest pain or shortness of breath.  There is no leg swelling or leg pain. Patient reports she gave birth vaginally 6 days ago without an epidural.  No pregnancy complications. Chart review shows history of factor V Leiden.  She states she only takes Lovenox when she was pregnant and she stopped this 2 days ago.  There is no chest pain or shortness of breath.  There is no vision changes.  The history is provided by the patient. The history is limited by a language barrier. A language interpreter was used.    Past Medical History:  Diagnosis Date   Diabetes mellitus without complication (HCC)    Factor V Leiden (HCC)    Foot fracture, left    Gestational diabetes    Headache    Tachycardia    Wrist fracture    left    Patient Active Problem List   Diagnosis Date Noted   Indication for care in labor or delivery 01/13/2019   GDM (gestational diabetes mellitus) 11/22/2018   Supervision of high risk pregnancy, antepartum 07/05/2018   Chronic headaches 07/29/2017   Heterozygous factor V Leiden affecting pregnancy, antepartum (HCC) 05/29/2015   Language barrier 05/29/2015   Four previous  miscarriages, affecting care of mother in second trimester, antepartum 05/29/2015    Past Surgical History:  Procedure Laterality Date   APPENDECTOMY       OB History    Gravida  9   Para  5   Term  5   Preterm      AB  4   Living  5     SAB  4   TAB      Ectopic      Multiple  0   Live Births  5            Home Medications    Prior to Admission medications   Medication Sig Start Date End Date Taking? Authorizing Provider  enoxaparin (LOVENOX) 40 MG/0.4ML injection Inject 0.4 mLs (40 mg total) into the skin daily. 40 ml prefilled syringes 1 qd 12/21/18   Hermina StaggersErvin, Michael L, MD  ibuprofen (ADVIL) 600 MG tablet Take 1 tablet (600 mg total) by mouth every 6 (six) hours. 01/15/19   Donette LarryBhambri, Melanie, CNM  pantoprazole (PROTONIX) 20 MG tablet Take 1 tablet (20 mg total) by mouth daily. 12/07/18 12/07/19  Adam PhenixArnold, James G, MD  Prenatal Vit-Fe Fumarate-FA (PRENATAL VITAMIN) 27-0.8 MG TABS Take 1 tablet by mouth daily. 11/06/18   Hermina StaggersErvin, Michael L, MD    Family History Family History  Problem Relation Age of Onset   Diabetes Mother    Hypertension Mother    Healthy Father    Hearing loss Neg Hx     Social History Social History   Tobacco Use   Smoking status: Never Smoker   Smokeless tobacco: Never Used  Substance Use Topics   Alcohol use: No   Drug use: No     Allergies   Patient has no known allergies.   Review of Systems Review of Systems  Constitutional: Negative for activity change, appetite change and fever.  HENT: Negative for congestion and rhinorrhea.   Eyes: Negative for photophobia.  Respiratory: Negative for chest tightness.   Cardiovascular: Negative for chest pain.  Gastrointestinal: Negative for abdominal pain, nausea and vomiting.  Endocrine: Negative for polyuria.  Genitourinary: Negative for enuresis and hematuria.  Musculoskeletal: Negative for arthralgias and myalgias.  Skin: Negative for rash.  Neurological: Positive for  weakness and numbness. Negative for dizziness and headaches.    all other systems are negative except as noted in the HPI and PMH.    Physical Exam Updated Vital Signs BP 118/79 (BP Location: Right Arm)    Pulse (!) 101    Temp 98.4 F (36.9 C) (Oral)    Resp 16    SpO2 98%   Physical Exam Vitals signs and nursing note reviewed.  Constitutional:      General: She is not in acute distress.    Appearance: She is well-developed.  HENT:     Head: Normocephalic and atraumatic.     Comments: No appreciable facial swelling.  Dentition nontender.  Floor of mouth soft.  No malocclusion or trismus.    Mouth/Throat:     Pharynx: No oropharyngeal exudate.  Eyes:     Conjunctiva/sclera: Conjunctivae normal.     Pupils: Pupils are equal, round, and reactive to light.  Neck:     Musculoskeletal: Normal range of motion and neck supple.     Comments: No meningismus. Cardiovascular:     Rate and Rhythm: Normal rate and regular rhythm.     Heart sounds: Normal heart sounds. No murmur.  Pulmonary:     Effort: Pulmonary effort is normal. No respiratory distress.     Breath sounds: Normal breath sounds.  Abdominal:     Palpations: Abdomen is soft.     Tenderness: There is no abdominal tenderness. There is no guarding or rebound.  Musculoskeletal: Normal range of motion.        General: No tenderness.  Skin:    General: Skin is warm.     Capillary Refill: Capillary refill takes less than 2 seconds.  Neurological:     General: No focal deficit present.     Mental Status: She is alert and oriented to person, place, and time. Mental status is at baseline.     Cranial Nerves: Cranial nerve deficit present.     Motor: No abnormal muscle tone.     Coordination: Coordination normal.     Comments: Asymmetric smile, left side of mouth droops.  Tongue deviates to the left.  Weakness raising left eyebrow and closing left eye. 5/5 strength in upper and lower extremities.  No pronator drift.  No ataxia  on finger-to-nose.  Psychiatric:        Behavior: Behavior normal.      ED Treatments / Results  Labs (all labs ordered are listed, but only abnormal results are displayed) Labs Reviewed  CBC WITH DIFFERENTIAL/PLATELET - Abnormal; Notable for the following components:  Result Value   RBC 5.27 (*)    Hemoglobin 15.4 (*)    HCT 46.6 (*)    All other components within normal limits  COMPREHENSIVE METABOLIC PANEL - Abnormal; Notable for the following components:   CO2 18 (*)    All other components within normal limits    EKG None  Radiology Mr Brain Wo Contrast  Result Date: 01/21/2019 CLINICAL DATA:  Initial evaluation for acute left facial pain and droop, numbness, right eye twitching. Recent postpartum. EXAM: MRI HEAD WITHOUT CONTRAST MRV HEAD WITHOUT CONTRAST TECHNIQUE: Multiplanar, multiecho pulse sequences of the brain and surrounding structures were obtained without intravenous contrast. Angiographic images of the intracranial venous structures were obtained using MRV technique without intravenous contrast. COMPARISON:  None available. FINDINGS: MRI BRAIN FINDINGS: Brain: Cerebral volume within normal limits for patient age. No focal parenchymal signal abnormality identified. No abnormal foci of restricted diffusion to suggest acute or subacute ischemia. Gray-white matter differentiation well maintained. No encephalomalacia to suggest chronic infarction. No foci of susceptibility artifact to suggest acute or chronic intracranial hemorrhage. No mass lesion, midline shift or mass effect. No hydrocephalus. No extra-axial fluid collection. Major dural sinuses are grossly patent. Pituitary gland and suprasellar region are normal. Midline structures intact and normal. Vascular: Major intracranial vascular flow voids well maintained and normal in appearance. Skull and upper cervical spine: Craniocervical junction normal. Visualized upper cervical spine within normal limits. Bone marrow  signal intensity normal. No scalp soft tissue abnormality. Sinuses/Orbits: Globes and orbital soft tissues within normal limits. Paranasal sinuses are clear. No mastoid effusion. Inner ear structures normal. Other: None. MRV HEAD FINDINGS: Normal flow related signal seen within the superior sagittal sinus to the level of the torcula. Transverse sinuses and sigmoid sinuses are widely patent bilaterally. Straight sinus, vein of Galen, and internal cerebral veins are patent as no evidence for dural sinus thrombosis. No appreciable dural sinus stenosis. Veins of Rosenthal. IMPRESSION: 1. Normal brain MRI.  No acute intracranial abnormality identified. 2. Normal intracranial MRV.  No evidence for dural sinus thrombosis. Electronically Signed   By: Rise MuBenjamin  McClintock M.D.   On: 01/21/2019 06:40   Mr Mrv Head Wo Cm  Result Date: 01/21/2019 CLINICAL DATA:  Initial evaluation for acute left facial pain and droop, numbness, right eye twitching. Recent postpartum. EXAM: MRI HEAD WITHOUT CONTRAST MRV HEAD WITHOUT CONTRAST TECHNIQUE: Multiplanar, multiecho pulse sequences of the brain and surrounding structures were obtained without intravenous contrast. Angiographic images of the intracranial venous structures were obtained using MRV technique without intravenous contrast. COMPARISON:  None available. FINDINGS: MRI BRAIN FINDINGS: Brain: Cerebral volume within normal limits for patient age. No focal parenchymal signal abnormality identified. No abnormal foci of restricted diffusion to suggest acute or subacute ischemia. Gray-white matter differentiation well maintained. No encephalomalacia to suggest chronic infarction. No foci of susceptibility artifact to suggest acute or chronic intracranial hemorrhage. No mass lesion, midline shift or mass effect. No hydrocephalus. No extra-axial fluid collection. Major dural sinuses are grossly patent. Pituitary gland and suprasellar region are normal. Midline structures intact and  normal. Vascular: Major intracranial vascular flow voids well maintained and normal in appearance. Skull and upper cervical spine: Craniocervical junction normal. Visualized upper cervical spine within normal limits. Bone marrow signal intensity normal. No scalp soft tissue abnormality. Sinuses/Orbits: Globes and orbital soft tissues within normal limits. Paranasal sinuses are clear. No mastoid effusion. Inner ear structures normal. Other: None. MRV HEAD FINDINGS: Normal flow related signal seen within the superior sagittal sinus to the  level of the torcula. Transverse sinuses and sigmoid sinuses are widely patent bilaterally. Straight sinus, vein of Galen, and internal cerebral veins are patent as no evidence for dural sinus thrombosis. No appreciable dural sinus stenosis. Veins of Rosenthal. IMPRESSION: 1. Normal brain MRI.  No acute intracranial abnormality identified. 2. Normal intracranial MRV.  No evidence for dural sinus thrombosis. Electronically Signed   By: Jeannine Boga M.D.   On: 01/21/2019 06:40    Procedures Procedures (including critical care time)  Medications Ordered in ED Medications - No data to display   Initial Impression / Assessment and Plan / ED Course  I have reviewed the triage vital signs and the nursing notes.  Pertinent labs & imaging results that were available during my care of the patient were reviewed by me and considered in my medical decision making (see chart for details).        Facial numbness with eye twitching and L sided facial pain. Exam appears consistent with bell's palsy on L. Forehead not spared. No extremity weakness.  Recent pregnancy and history of Factor V Leiden complicate presentation.  Discussed with Dr. Rory Percy with neurology.  He recommends MRI to rule out brainstem stroke.  As well as MRV to assess for possible venous thrombosis.  MRI and MRV are negative. Will treat with steroids and valtrex for presumed bell's palsy. D/w patient  that short courses should be safe with breast feeding but encouraged to let her OB know.   Continue valtrex, steroids, lacrilube for eye protection. Followup with PCP and neurology.  Return precautions discussed.    Final Clinical Impressions(s) / ED Diagnoses   Final diagnoses:  Bell's palsy    ED Discharge Orders    None       Fritzi Scripter, Annie Main, MD 01/21/19 [phone]

## 2019-01-21 NOTE — ED Notes (Signed)
Pt. Requesting breast pump. Post-partum x6 days

## 2019-01-21 NOTE — ED Notes (Signed)
Introduced myself to pt via interpretor ipad.  Pt requesting we "hurry up"

## 2019-01-25 ENCOUNTER — Other Ambulatory Visit: Payer: Medicaid Other

## 2019-01-25 ENCOUNTER — Encounter: Payer: Medicaid Other | Admitting: Obstetrics and Gynecology

## 2019-02-23 ENCOUNTER — Encounter: Payer: Self-pay | Admitting: *Deleted

## 2019-02-27 ENCOUNTER — Telehealth: Payer: Self-pay | Admitting: Student

## 2019-02-27 ENCOUNTER — Other Ambulatory Visit: Payer: Self-pay | Admitting: *Deleted

## 2019-02-27 DIAGNOSIS — O24429 Gestational diabetes mellitus in childbirth, unspecified control: Secondary | ICD-10-CM

## 2019-02-27 NOTE — Telephone Encounter (Signed)
Called the patient and left a detailed voicemail of the upcoming appointment. Informed the patient if she has been diagnosed with covid19, in close contact with someone who has had covid19, or experienced any flu-like symptoms such as fever, rash, sore throat, or shortness of breath please call our office to reschedule the appointment. If you have any questions or concerns please give our office a call at [phone]. °

## 2019-02-28 ENCOUNTER — Other Ambulatory Visit: Payer: Self-pay

## 2019-02-28 ENCOUNTER — Ambulatory Visit (INDEPENDENT_AMBULATORY_CARE_PROVIDER_SITE_OTHER): Payer: Medicaid Other | Admitting: Obstetrics & Gynecology

## 2019-02-28 ENCOUNTER — Encounter: Payer: Self-pay | Admitting: Obstetrics & Gynecology

## 2019-02-28 ENCOUNTER — Other Ambulatory Visit: Payer: Medicaid Other

## 2019-02-28 ENCOUNTER — Other Ambulatory Visit (HOSPITAL_COMMUNITY)
Admission: RE | Admit: 2019-02-28 | Discharge: 2019-02-28 | Disposition: A | Discharge status: Home / Self Care | Payer: Medicaid Other | Source: Ambulatory Visit | Attending: Obstetrics & Gynecology | Admitting: Obstetrics & Gynecology

## 2019-02-28 VITALS — BP 100/68 | HR 81 | Wt 144.0 lb

## 2019-02-28 DIAGNOSIS — Z0000 Encounter for general adult medical examination without abnormal findings: Secondary | ICD-10-CM | POA: Insufficient documentation

## 2019-02-28 DIAGNOSIS — Z1389 Encounter for screening for other disorder: Secondary | ICD-10-CM | POA: Diagnosis not present

## 2019-02-28 DIAGNOSIS — O24429 Gestational diabetes mellitus in childbirth, unspecified control: Secondary | ICD-10-CM

## 2019-02-28 DIAGNOSIS — Z789 Other specified health status: Secondary | ICD-10-CM

## 2019-02-28 NOTE — Progress Notes (Signed)
Subjective:   [name] is a 33 y.o. married P5 who presents for a postpartum visit. She is 6 weeks postpartum following a spontaneous vaginal delivery. I have fully reviewed the prenatal and intrapartum course. The delivery was at 70 w gestational weeks. It was an IOL for GDM. Outcome: spontaneous vaginal delivery. Anesthesia: none. Postpartum course has been good. Baby's course has been good.  Baby is feeding by breast. Bleeding no bleeding. Bowel function is normal. Bladder function is normal. Patient is not sexually active. Contraception method is unsure. She wants to discuss this with her husband. She reports that she has not had sex since delivery. Postpartum depression screening: negative.  The following portions of the patient's history were reviewed and updated as appropriate: allergies, current medications, past family history, past medical history, past social history, past surgical history and problem list.  Review of Systems Pertinent items are noted in HPI.   Objective:    There were no vitals taken for this visit.  General:  alert   Breasts:  inspection negative, no nipple discharge or bleeding, no masses or nodularity palpable  Lungs: clear to auscultation bilaterally  Heart:  regular rate and rhythm, S1, S2 normal, no murmur, click, rub or gallop  Abdomen: soft, non-tender; bowel sounds normal; no masses,  no organomegaly   Vulva:  normal  Vagina: normal vagina  Cervix:  anteverted  Corpus: normal size, contour, position, consistency, mobility, non-tender  Adnexa:  not evaluated  Rectal Exam: Not performed.        Assessment:     Normal postpartum exam. Pap smear done at today's visit.   Plan:    1. Contraception: none 2. Pap smear done today 3. 2 hour GTT for h/o GDM with this last pregnancy 4. I have rec'd healthy lifestyle choices.

## 2019-03-01 LAB — CYTOLOGY - PAP
Diagnosis: NEGATIVE
HPV: NOT DETECTED

## 2019-03-01 LAB — GLUCOSE TOLERANCE, 2 HOURS
Glucose, 2 hour: 109 mg/dL (ref 65–139)
Glucose, GTT - Fasting: 82 mg/dL (ref 65–99)

## 2019-11-24 IMAGING — US US MFM OB FOLLOW UP
1 series · 14 of 28 positions shown · non-contrast
Comparison: none

[Series 1: us mfm ob follow up · 28 acquisitions, 14 frames shown]
[im 2/28]
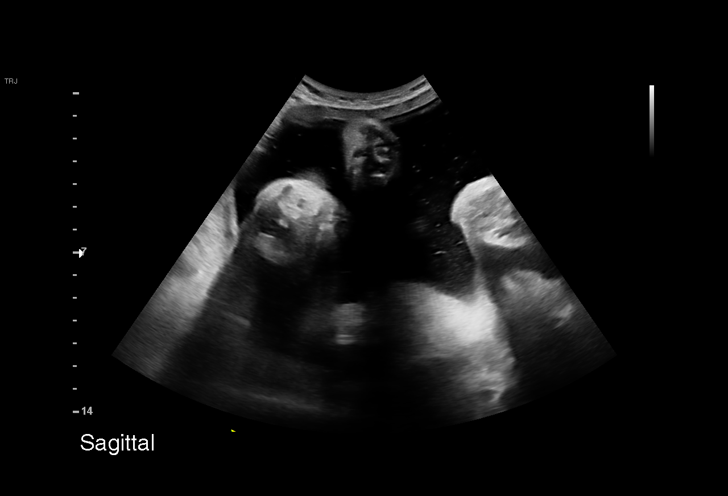
[im 4/28]
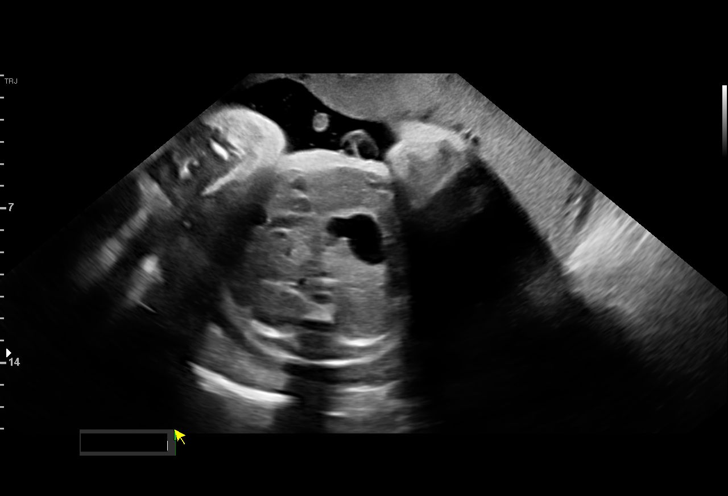
[im 6/28]
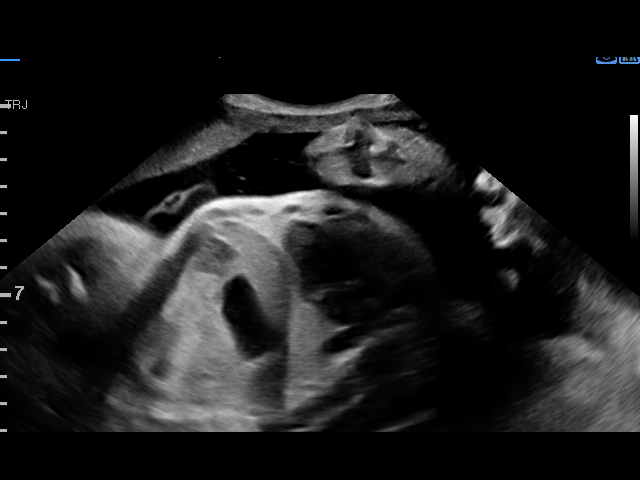
[im 8/28]
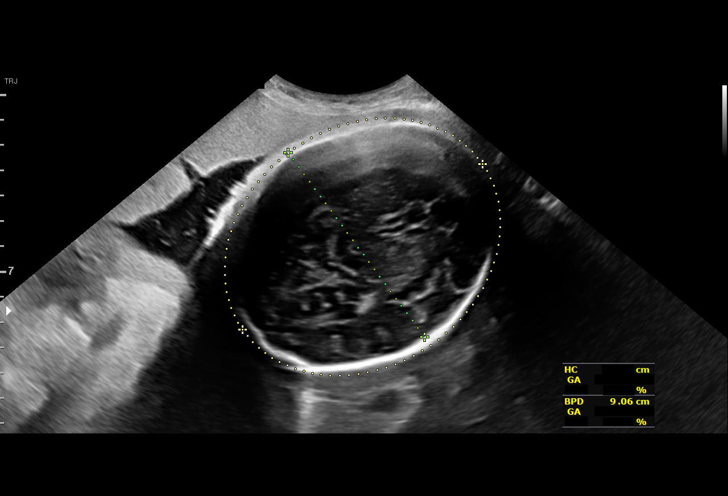
[im 10/28]
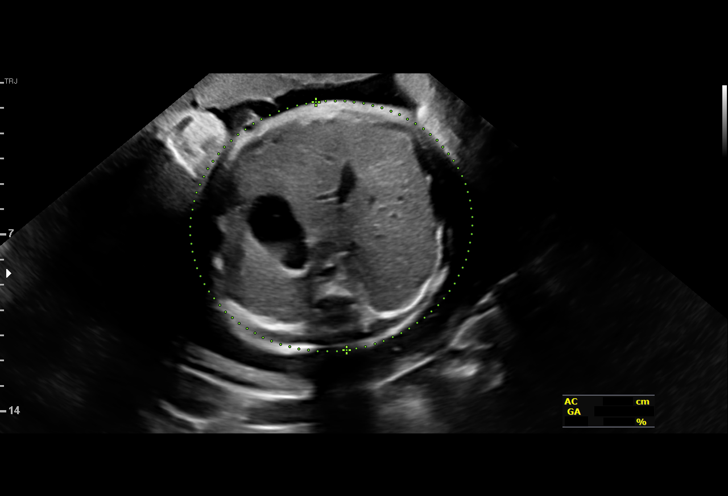
[im 12/28]
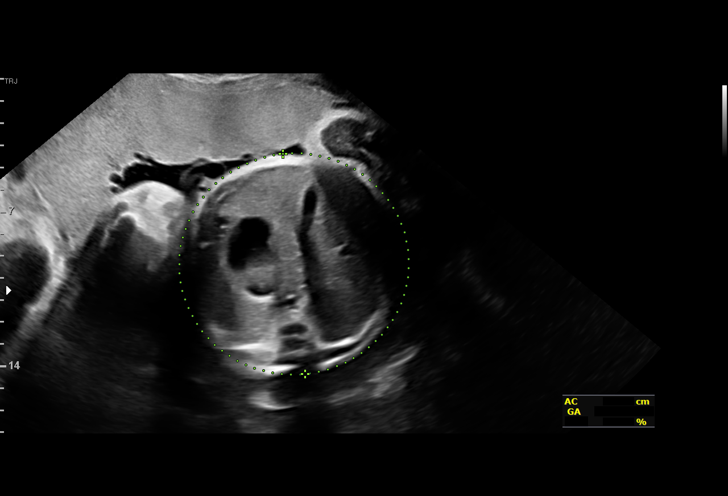
[im 14/28]
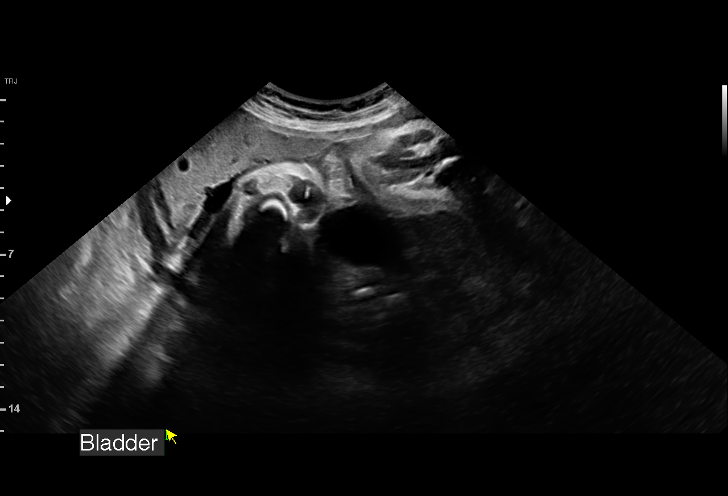
[im 16/28]
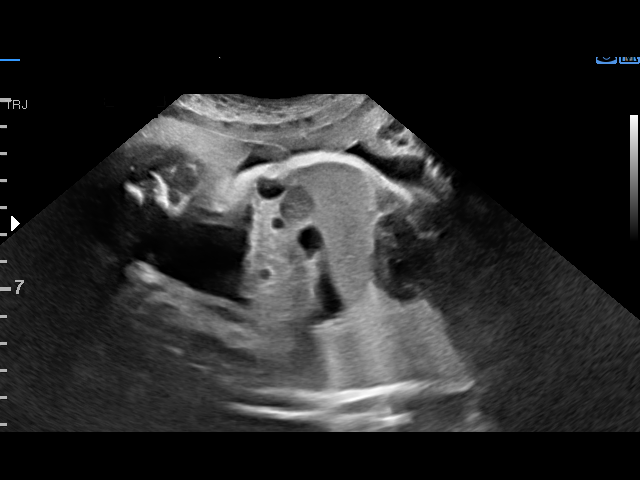
[im 18/28]
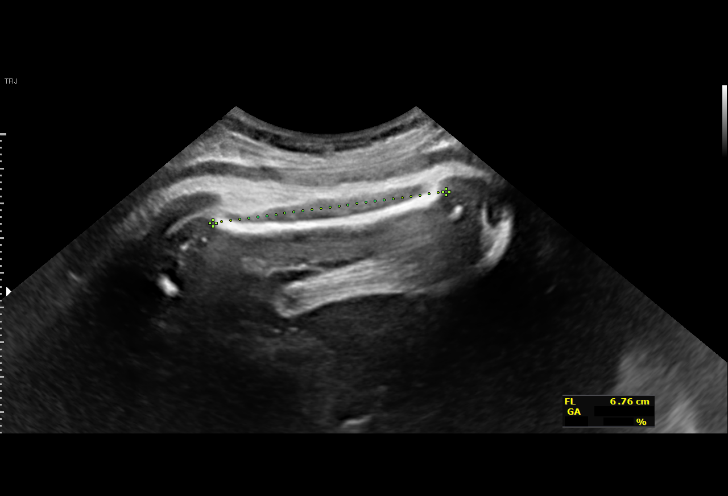
[im 20/28]
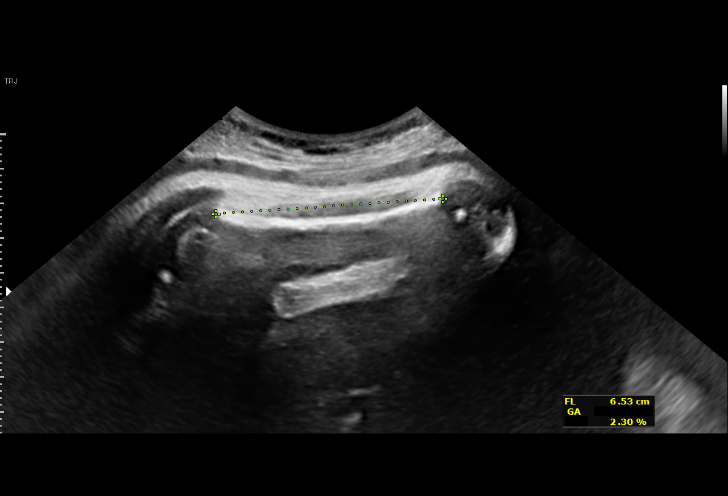
[im 22/28]
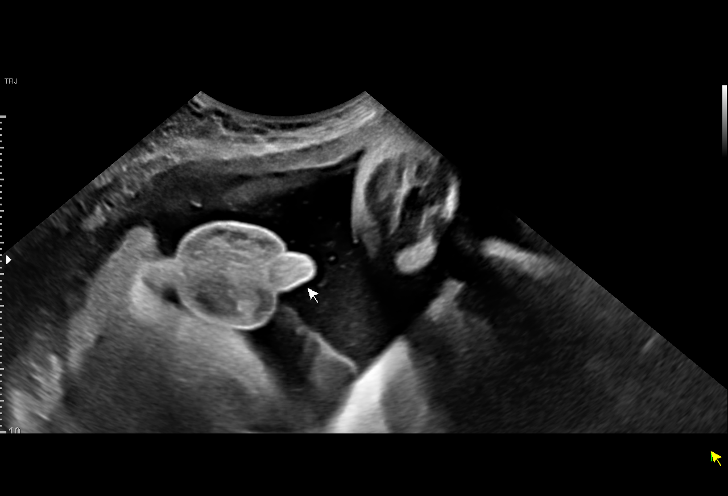
[im 24/28]
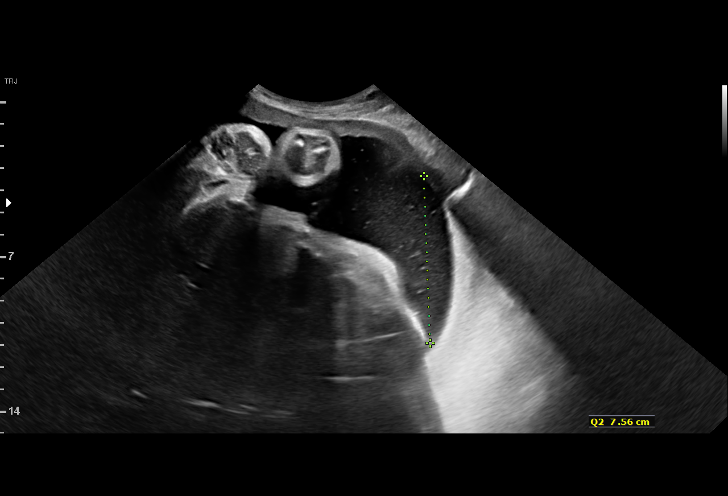
[im 26/28]
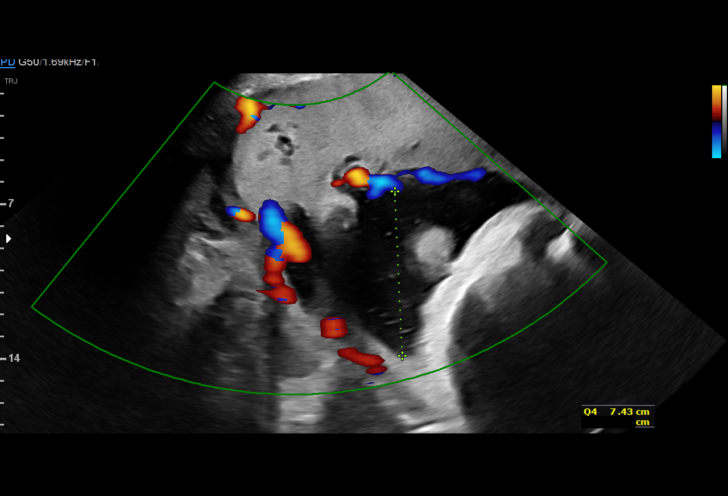
[im 28/28]
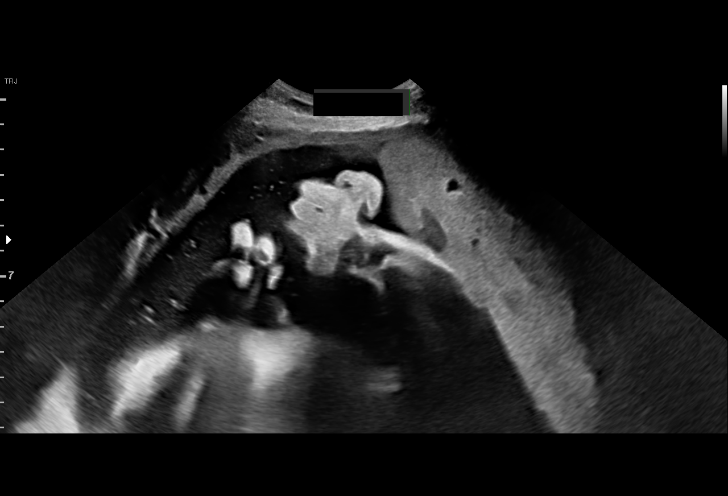

[14 of 28 positions shown; findings below may reference images not displayed]

[address]

 ----------------------------------------------------------------------

 ----------------------------------------------------------------------
Indications

  Factor V Leiden mutation affecting
  pregnancy
  Gestational diabetes in pregnancy, insulin
  controlled
  36 weeks gestation of pregnancy
 ----------------------------------------------------------------------
Vital Signs

                                                Height:        5'4"
Fetal Evaluation

 Num Of Fetuses:         1
 Fetal Heart Rate(bpm):  142
 Cardiac Activity:       Observed
 Presentation:           Cephalic
 Placenta:               Anterior
 P. Cord Insertion:      Previously Visualized

 Amniotic Fluid
 AFI FV:      Within normal limits

 AFI Sum(cm)     %Tile       Largest Pocket(cm)
 20.59           78

 RUQ(cm)       RLQ(cm)       LUQ(cm)        LLQ(cm)

Biometry
 BPD:      90.4  mm     G. Age:  36w 4d         67  %    CI:        75.22   %    70 - 86
                                                         FL/HC:      19.8   %    20.1 -
 HC:      330.6  mm     G. Age:  37w 5d         51  %    HC/AC:      1.04        0.93 -
 AC:      317.3  mm     G. Age:  35w 5d         39  %    FL/BPD:     72.5   %    71 - 87
 FL:       65.5  mm     G. Age:  33w 5d          3  %    FL/AC:      20.6   %    20 - 24

 Est. FW:    1803  gm    5 lb 15 oz      27  %
OB History

 Gravidity:    9         Term:   4         SAB:   4
 Living:       4
Gestational Age

 LMP:           36w 3d        Date:  04/15/18                 EDD:   01/20/19
 U/S Today:     36w 0d                                        EDD:   01/23/19
 Best:          36w 3d     Det. By:  LMP  (04/15/18)          EDD:   01/20/19
Anatomy

 Cranium:               Appears normal         LVOT:                   Previously seen
 Cavum:                 Appears normal         Aortic Arch:            Previously seen
 Ventricles:            Previously seen        Ductal Arch:            Previously seen
 Choroid Plexus:        Previously seen        Diaphragm:              Previously seen
 Cerebellum:            Previously seen        Stomach:                Appears normal, left
                                                                       sided
 Posterior Fossa:       Previously seen        Abdomen:                Previously seen
 Nuchal Fold:           Previously seen        Abdominal Wall:         Previously seen
 Face:                  Profile appears        Cord Vessels:           Previously seen
                        normal
 Lips:                  Appears normal         Kidneys:                Appear normal
 Palate:                Previously seen        Bladder:                Appears normal
 Thoracic:              Previously seen        Spine:                  Previously seen
 Heart:                 Previously seen        Upper Extremities:      Previously seen
 RVOT:                  Previously seen        Lower Extremities:      Previously seen

 Other:  Male gender.
Impression

 Normal interval growth.
 [id number]
Recommendations

 Continue weekly testing (previously scheduled).
 Consider delivery between 37-39 weeks

## 2019-12-20 IMAGING — MR MRI HEAD WITHOUT CONTRAST
11 of 14 series · 42 of 48 positions shown · non-contrast
Comparison: None available.

CLINICAL DATA: Initial evaluation for acute left facial pain and
droop, numbness, right eye twitching. Recent postpartum.

EXAM:
MRI HEAD WITHOUT CONTRAST
MRV HEAD WITHOUT CONTRAST
TECHNIQUE: Multiplanar, multiecho pulse sequences of the brain and surrounding
structures were obtained without intravenous contrast. Angiographic
images of the intracranial venous structures were obtained using MRV
technique without intravenous contrast.

[Series 9: DWI · axial · 3.0mm · 0.88mm/px · z∈[-85,+37]mm · 5 of 84 slices shown (1 of 4)]
[im 1/84]
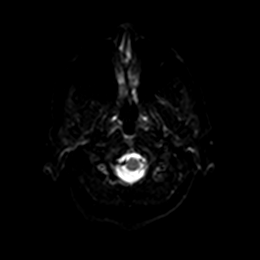
[im 21/84]
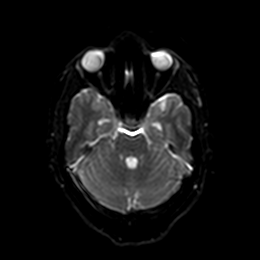
[im 42/84]
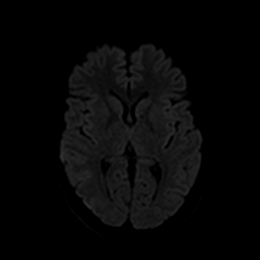
[im 63/84]
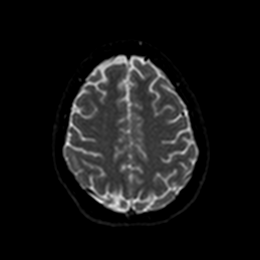
[im 84/84]
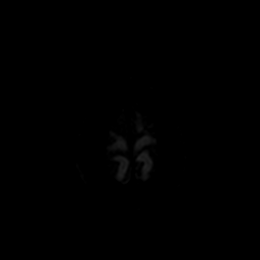

[Series 10: DWI · axial · 3.0mm · 0.88mm/px · z∈[-85,+37]mm · 3 of 42 slices shown (2 of 4)]
[im 1/42]
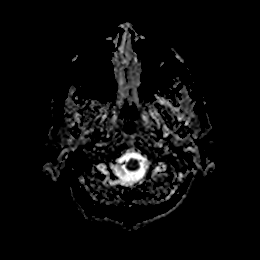
[im 21/42]
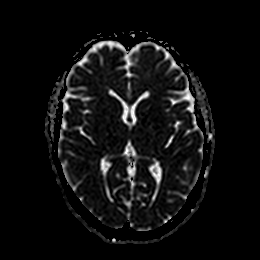
[im 42/42]
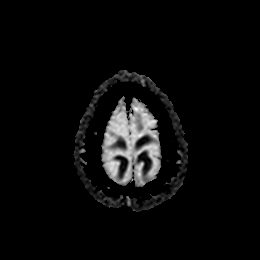

[Series 11: DWI · coronal · 4.0mm · 0.88mm/px · 5 of 68 slices shown (3 of 4)]
[im 1/68]
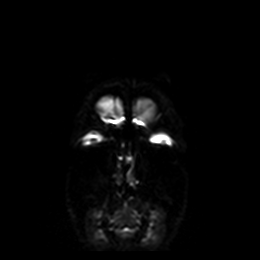
[im 17/68]
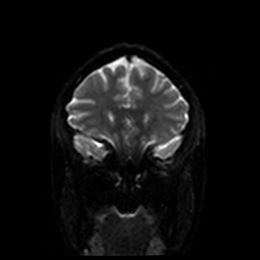
[im 34/68]
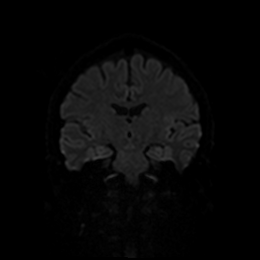
[im 51/68]
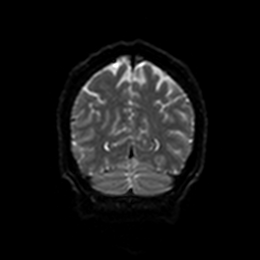
[im 68/68]
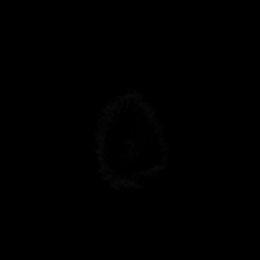

[Series 12: DWI · coronal · 4.0mm · 0.88mm/px · 2 of 34 slices shown (4 of 4)]
[im 1/34]
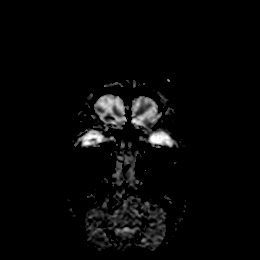
[im 34/34]
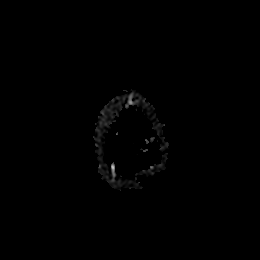

[Series 13: T1 · sagittal · 5.0mm · 0.75mm/px · 2 of 25 slices shown]
[im 1/25]
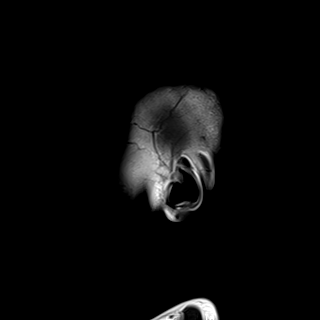
[im 25/25]
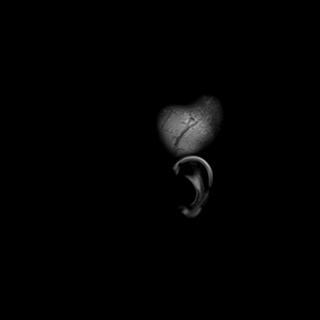

[Series 18: T2 · axial · 5.0mm · 0.72mm/px · z∈[-98,+50]mm · 2 of 26 slices shown (1 of 2)]
[im 1/26]
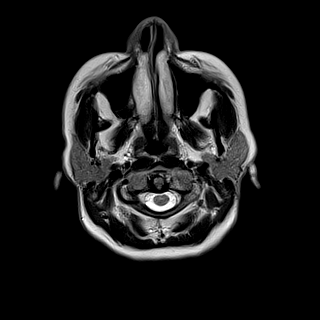
[im 26/26]
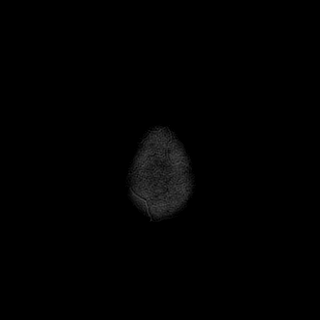

[Series 19: FLAIR · axial · 5.0mm · 0.45mm/px · z∈[-96,+52]mm · 2 of 26 slices shown]
[im 1/26]
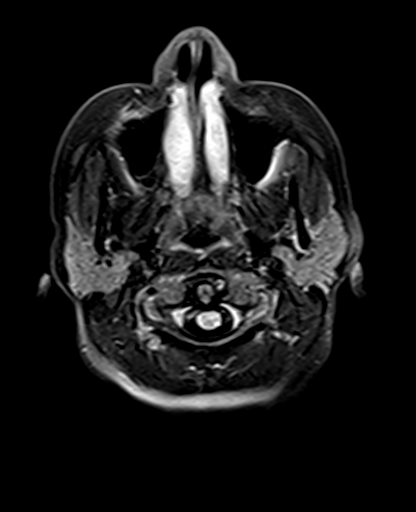
[im 26/26]
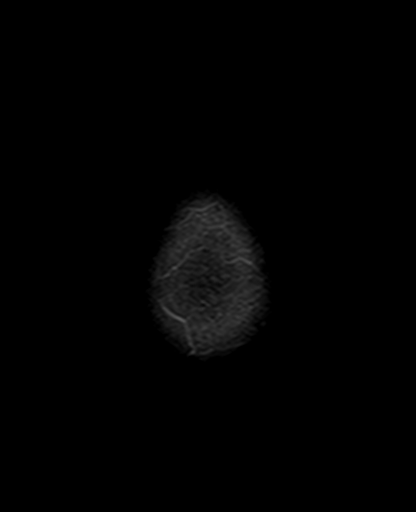

[Series 21: venous inhance coronal_msum · coronal · portal-venous · 0.9mm · 0.57mm/px · 11 of 160 slices shown]
[im 1/160]
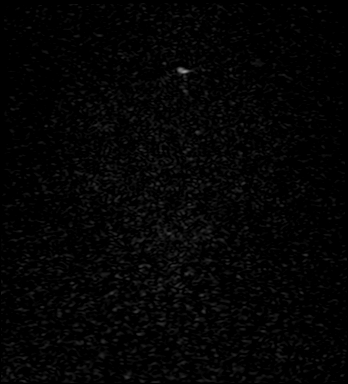
[im 16/160]
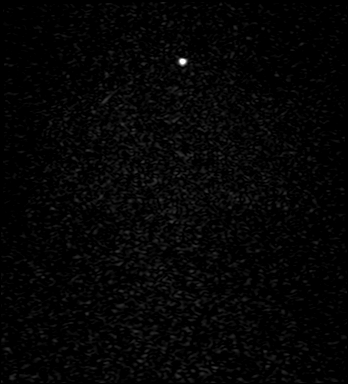
[im 32/160]
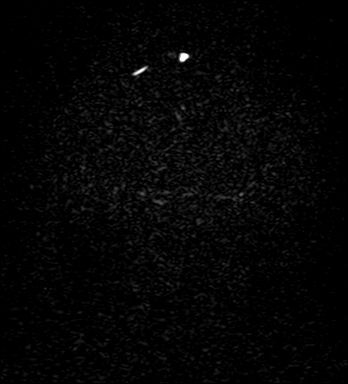
[im 48/160]
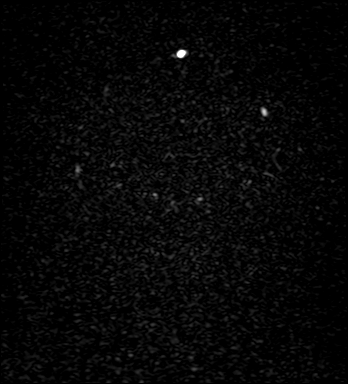
[im 64/160]
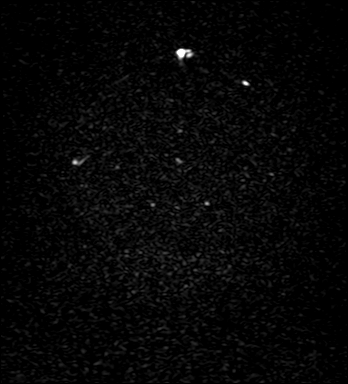
[im 80/160]
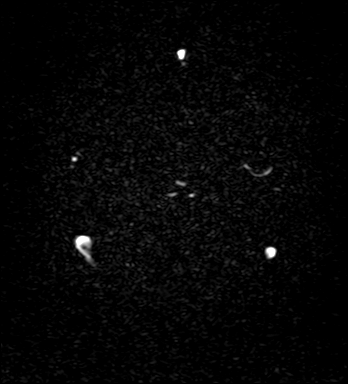
[im 96/160]
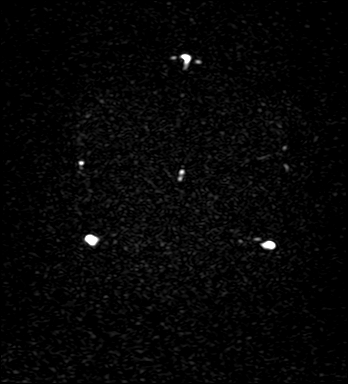
[im 112/160]
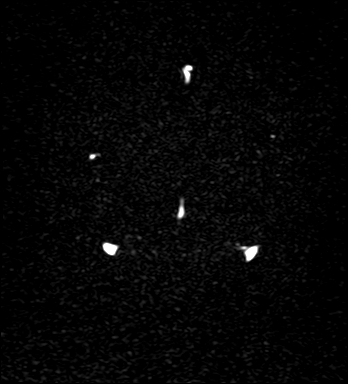
[im 128/160]
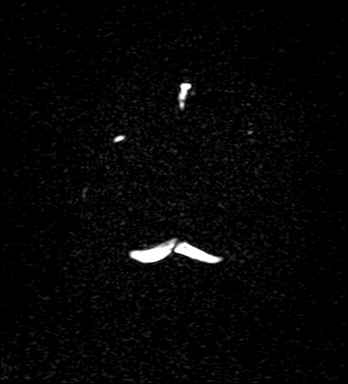
[im 144/160]
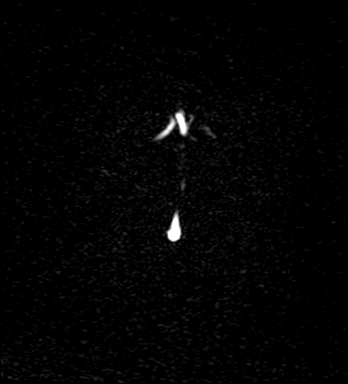
[im 160/160]
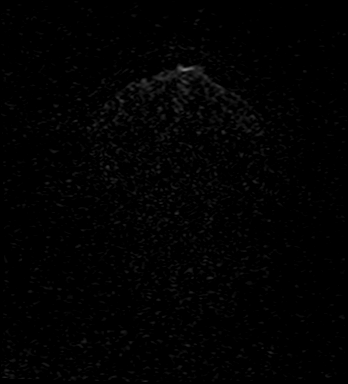

[Series 25: pha_images · axial · 3.0mm · 0.90mm/px · z∈[-111,+60]mm · 4 of 59 slices shown]
[im 1/59]
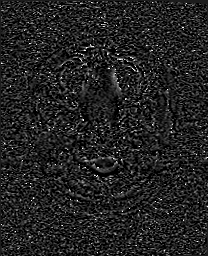
[im 20/59]
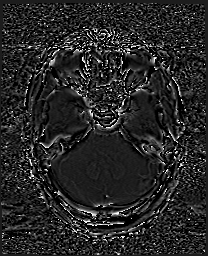
[im 39/59]
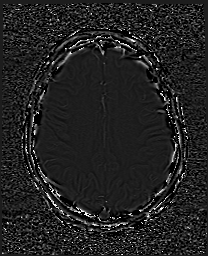
[im 59/59]
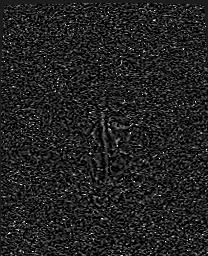

[Series 26: swi_images · axial · 3.0mm · 0.90mm/px · z∈[-111,+63]mm · 4 of 60 slices shown]
[im 1/60]
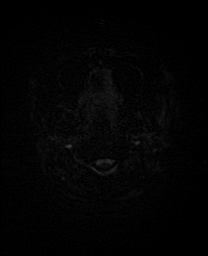
[im 20/60]
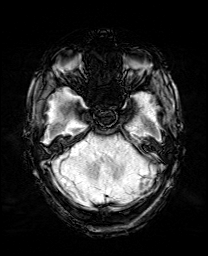
[im 40/60]
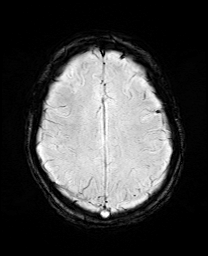
[im 60/60]
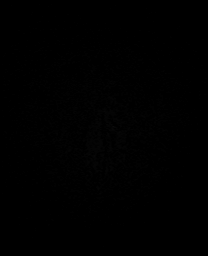

[Series 29: T2 · coronal · 5.0mm · 0.34mm/px · 2 of 30 slices shown (2 of 2)]
[im 1/30]
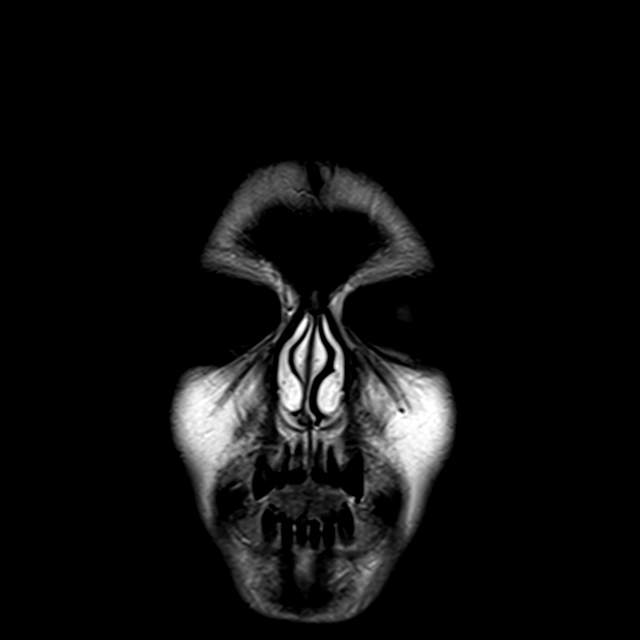
[im 30/30]
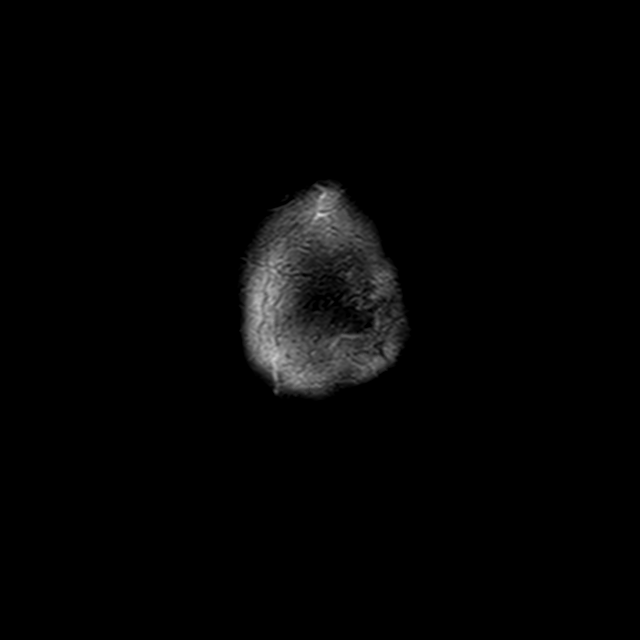

[42 of 48 positions shown; findings below may reference images not displayed]

FINDINGS: MRI BRAIN FINDINGS:

Brain: Cerebral volume within normal limits for patient age. No
focal parenchymal signal abnormality identified.

No abnormal foci of restricted diffusion to suggest acute or
subacute ischemia. Gray-white matter differentiation well
maintained. No encephalomalacia to suggest chronic infarction. No
foci of susceptibility artifact to suggest acute or chronic
intracranial hemorrhage.

No mass lesion, midline shift or mass effect. No hydrocephalus. No
extra-axial fluid collection. Major dural sinuses are grossly
patent.

Pituitary gland and suprasellar region are normal. Midline
structures intact and normal.

Vascular: Major intracranial vascular flow voids well maintained and
normal in appearance.

Skull and upper cervical spine: Craniocervical junction normal.
Visualized upper cervical spine within normal limits. Bone marrow
signal intensity normal. No scalp soft tissue abnormality.

Sinuses/Orbits: Globes and orbital soft tissues within normal
limits.

Paranasal sinuses are clear. No mastoid effusion. Inner ear
structures normal.

Other: None.

MRV HEAD FINDINGS:

Normal flow related signal seen within the superior sagittal sinus
to the level of the torcula. Transverse sinuses and sigmoid sinuses
are widely patent bilaterally. Straight sinus, vein of Pawan, and
internal cerebral veins are patent as no evidence for dural sinus
thrombosis. No appreciable dural sinus stenosis. Veins of Latashia.
IMPRESSION: 1. Normal brain MRI.  No acute intracranial abnormality identified.
2. Normal intracranial MRV.  No evidence for dural sinus thrombosis.

## 2024-02-02 ENCOUNTER — Ambulatory Visit: Payer: Self-pay | Admitting: Nurse Practitioner
